# Patient Record
Sex: Female | Born: 1955
Health system: Southern US, Community
[De-identification: ages and names within clinical notes are randomized; demographics above are authoritative.]

## PROBLEM LIST (undated history)

## (undated) DIAGNOSIS — F32A Depression, unspecified: Secondary | ICD-10-CM

## (undated) DIAGNOSIS — M069 Rheumatoid arthritis, unspecified: Secondary | ICD-10-CM

## (undated) DIAGNOSIS — J382 Nodules of vocal cords: Secondary | ICD-10-CM

## (undated) DIAGNOSIS — K219 Gastro-esophageal reflux disease without esophagitis: Secondary | ICD-10-CM

## (undated) DIAGNOSIS — M8580 Other specified disorders of bone density and structure, unspecified site: Secondary | ICD-10-CM

## (undated) DIAGNOSIS — E785 Hyperlipidemia, unspecified: Secondary | ICD-10-CM

## (undated) DIAGNOSIS — E039 Hypothyroidism, unspecified: Secondary | ICD-10-CM

## (undated) DIAGNOSIS — F329 Major depressive disorder, single episode, unspecified: Secondary | ICD-10-CM

## (undated) DIAGNOSIS — K589 Irritable bowel syndrome without diarrhea: Secondary | ICD-10-CM

## (undated) DIAGNOSIS — E079 Disorder of thyroid, unspecified: Secondary | ICD-10-CM

## (undated) HISTORY — DX: Hyperlipidemia, unspecified: E78.5

## (undated) HISTORY — DX: Depression, unspecified: F32.A

## (undated) HISTORY — DX: Disorder of thyroid, unspecified: E07.9

## (undated) HISTORY — DX: Other specified disorders of bone density and structure, unspecified site: M85.80

## (undated) HISTORY — DX: Major depressive disorder, single episode, unspecified: F32.9

## (undated) HISTORY — DX: Nodules of vocal cords: J38.2

## (undated) HISTORY — DX: Irritable bowel syndrome, unspecified: K58.9

## (undated) HISTORY — PX: EYE SURGERY: SHX253

## (undated) HISTORY — DX: Gastro-esophageal reflux disease without esophagitis: K21.9

## (undated) HISTORY — DX: Rheumatoid arthritis, unspecified: M06.9

## (undated) HISTORY — DX: Hypothyroidism, unspecified: E03.9

---

## 2002-08-29 ENCOUNTER — Ambulatory Visit (HOSPITAL_COMMUNITY): Admission: RE | Admit: 2002-08-29 | Discharge: 2002-08-29 | Payer: Self-pay | Admitting: Family Medicine

## 2002-08-29 ENCOUNTER — Encounter: Payer: Self-pay | Admitting: Family Medicine

## 2002-09-04 ENCOUNTER — Ambulatory Visit (HOSPITAL_COMMUNITY): Admission: RE | Admit: 2002-09-04 | Discharge: 2002-09-04 | Payer: Self-pay | Admitting: Family Medicine

## 2002-09-04 ENCOUNTER — Encounter: Payer: Self-pay | Admitting: Family Medicine

## 2002-10-22 ENCOUNTER — Emergency Department (HOSPITAL_COMMUNITY): Admission: EM | Admit: 2002-10-22 | Discharge: 2002-10-22 | Payer: Self-pay | Admitting: Emergency Medicine

## 2004-01-04 ENCOUNTER — Ambulatory Visit (HOSPITAL_COMMUNITY): Admission: RE | Admit: 2004-01-04 | Discharge: 2004-01-04 | Payer: Self-pay | Admitting: Family Medicine

## 2004-03-03 ENCOUNTER — Ambulatory Visit: Payer: Self-pay | Admitting: Urgent Care

## 2004-04-13 ENCOUNTER — Ambulatory Visit: Payer: Self-pay | Admitting: Internal Medicine

## 2006-02-16 ENCOUNTER — Emergency Department (HOSPITAL_COMMUNITY): Admission: EM | Admit: 2006-02-16 | Discharge: 2006-02-16 | Payer: Self-pay | Admitting: Emergency Medicine

## 2007-01-07 ENCOUNTER — Ambulatory Visit (HOSPITAL_COMMUNITY): Admission: RE | Admit: 2007-01-07 | Discharge: 2007-01-07 | Payer: Self-pay | Admitting: Family Medicine

## 2008-06-08 ENCOUNTER — Ambulatory Visit (HOSPITAL_COMMUNITY): Admission: RE | Admit: 2008-06-08 | Discharge: 2008-06-08 | Payer: Self-pay | Admitting: Family Medicine

## 2009-11-01 ENCOUNTER — Ambulatory Visit (HOSPITAL_COMMUNITY): Admission: RE | Admit: 2009-11-01 | Discharge: 2009-11-01 | Payer: Self-pay | Admitting: Family Medicine

## 2009-11-08 ENCOUNTER — Encounter: Admission: RE | Admit: 2009-11-08 | Discharge: 2009-11-08 | Payer: Self-pay | Admitting: Family Medicine

## 2010-05-15 ENCOUNTER — Encounter: Payer: Self-pay | Admitting: Family Medicine

## 2010-11-28 ENCOUNTER — Other Ambulatory Visit: Payer: Self-pay | Admitting: Family Medicine

## 2010-11-28 DIAGNOSIS — Z139 Encounter for screening, unspecified: Secondary | ICD-10-CM

## 2010-12-12 ENCOUNTER — Ambulatory Visit (HOSPITAL_COMMUNITY)
Admission: RE | Admit: 2010-12-12 | Discharge: 2010-12-12 | Disposition: A | Discharge status: Home / Self Care | Payer: 59 | Source: Ambulatory Visit | Attending: Family Medicine | Admitting: Family Medicine

## 2010-12-12 DIAGNOSIS — Z139 Encounter for screening, unspecified: Secondary | ICD-10-CM

## 2010-12-12 DIAGNOSIS — Z1231 Encounter for screening mammogram for malignant neoplasm of breast: Secondary | ICD-10-CM | POA: Insufficient documentation

## 2011-10-30 ENCOUNTER — Other Ambulatory Visit: Payer: Self-pay | Admitting: Family Medicine

## 2011-10-30 ENCOUNTER — Ambulatory Visit (HOSPITAL_COMMUNITY)
Admission: RE | Admit: 2011-10-30 | Discharge: 2011-10-30 | Disposition: A | Discharge status: Home / Self Care | Payer: 59 | Source: Ambulatory Visit | Attending: Family Medicine | Admitting: Family Medicine

## 2011-10-30 DIAGNOSIS — R609 Edema, unspecified: Secondary | ICD-10-CM

## 2011-10-30 DIAGNOSIS — M25439 Effusion, unspecified wrist: Secondary | ICD-10-CM | POA: Insufficient documentation

## 2011-10-30 DIAGNOSIS — M25539 Pain in unspecified wrist: Secondary | ICD-10-CM | POA: Insufficient documentation

## 2011-10-30 DIAGNOSIS — R52 Pain, unspecified: Secondary | ICD-10-CM

## 2012-02-02 ENCOUNTER — Other Ambulatory Visit: Payer: Self-pay | Admitting: Family Medicine

## 2012-02-02 DIAGNOSIS — Z139 Encounter for screening, unspecified: Secondary | ICD-10-CM

## 2012-02-08 ENCOUNTER — Ambulatory Visit (HOSPITAL_COMMUNITY)
Admission: RE | Admit: 2012-02-08 | Discharge: 2012-02-08 | Disposition: A | Discharge status: Home / Self Care | Payer: 59 | Source: Ambulatory Visit | Attending: Family Medicine | Admitting: Family Medicine

## 2012-02-08 DIAGNOSIS — Z139 Encounter for screening, unspecified: Secondary | ICD-10-CM

## 2012-02-08 DIAGNOSIS — Z1231 Encounter for screening mammogram for malignant neoplasm of breast: Secondary | ICD-10-CM | POA: Insufficient documentation

## 2013-03-02 ENCOUNTER — Other Ambulatory Visit: Payer: Self-pay | Admitting: Family Medicine

## 2013-04-21 ENCOUNTER — Telehealth: Payer: Self-pay | Admitting: Family Medicine

## 2013-04-21 NOTE — Telephone Encounter (Signed)
Last office visit approx 1 yr ago

## 2013-04-21 NOTE — Telephone Encounter (Signed)
Pt requesting refill on [facility], she would like a couple months worth to get her through so she don't have to come in here during cold/flu season, please call pt when done

## 2013-04-21 NOTE — Telephone Encounter (Signed)
Ok plus two mo ref 

## 2013-04-22 MED ORDER — LEVOTHYROXINE SODIUM 50 MCG PO TABS: 50.0000 ug | ORAL_TABLET | Freq: Every day | ORAL | Status: DC

## 2013-04-22 NOTE — Telephone Encounter (Signed)
Med sent to pharm. Pt notified on voicemail.  

## 2013-06-30 ENCOUNTER — Telehealth: Payer: Self-pay | Admitting: Family Medicine

## 2013-06-30 NOTE — Telephone Encounter (Signed)
Made appt for physical and pap with Hoyle Sauer end of month.  Please fill out blood work papers and let her know when lab has them so she can get labs done before appt.

## 2013-07-02 ENCOUNTER — Other Ambulatory Visit: Payer: Self-pay | Admitting: Nurse Practitioner

## 2013-07-02 DIAGNOSIS — E039 Hypothyroidism, unspecified: Secondary | ICD-10-CM | POA: Insufficient documentation

## 2013-07-02 DIAGNOSIS — Z0189 Encounter for other specified special examinations: Secondary | ICD-10-CM

## 2013-07-02 DIAGNOSIS — E785 Hyperlipidemia, unspecified: Secondary | ICD-10-CM | POA: Insufficient documentation

## 2013-07-02 NOTE — Telephone Encounter (Signed)
Need paper chart. Thanks.

## 2013-07-14 ENCOUNTER — Other Ambulatory Visit: Payer: Self-pay | Admitting: Family Medicine

## 2013-07-14 DIAGNOSIS — Z1231 Encounter for screening mammogram for malignant neoplasm of breast: Secondary | ICD-10-CM

## 2013-07-16 ENCOUNTER — Encounter: Payer: Self-pay | Admitting: Nurse Practitioner

## 2013-07-17 ENCOUNTER — Ambulatory Visit (INDEPENDENT_AMBULATORY_CARE_PROVIDER_SITE_OTHER): Payer: 59 | Admitting: Nurse Practitioner

## 2013-07-17 ENCOUNTER — Encounter: Payer: Self-pay | Admitting: Nurse Practitioner

## 2013-07-17 ENCOUNTER — Ambulatory Visit (HOSPITAL_COMMUNITY)
Admission: RE | Admit: 2013-07-17 | Discharge: 2013-07-17 | Disposition: A | Discharge status: Home / Self Care | Payer: 59 | Source: Ambulatory Visit | Attending: Family Medicine | Admitting: Family Medicine

## 2013-07-17 VITALS — BP 124/70 | Ht 67.0 in | Wt 162.0 lb

## 2013-07-17 DIAGNOSIS — E785 Hyperlipidemia, unspecified: Secondary | ICD-10-CM

## 2013-07-17 DIAGNOSIS — Z1231 Encounter for screening mammogram for malignant neoplasm of breast: Secondary | ICD-10-CM

## 2013-07-17 DIAGNOSIS — Z0000 Encounter for general adult medical examination without abnormal findings: Secondary | ICD-10-CM

## 2013-07-17 DIAGNOSIS — Z124 Encounter for screening for malignant neoplasm of cervix: Secondary | ICD-10-CM

## 2013-07-17 DIAGNOSIS — Z01419 Encounter for gynecological examination (general) (routine) without abnormal findings: Secondary | ICD-10-CM

## 2013-07-17 DIAGNOSIS — Z23 Encounter for immunization: Secondary | ICD-10-CM

## 2013-07-18 LAB — HEPATIC FUNCTION PANEL
ALT: 22 U/L (ref 0–35)
AST: 23 U/L (ref 0–37)
Albumin: 4.4 g/dL (ref 3.5–5.2)
Alkaline Phosphatase: 58 U/L (ref 39–117)
BILIRUBIN DIRECT: 0.1 mg/dL (ref 0.0–0.3)
Indirect Bilirubin: 0.4 mg/dL (ref 0.2–1.2)
TOTAL PROTEIN: 6.9 g/dL (ref 6.0–8.3)
Total Bilirubin: 0.5 mg/dL (ref 0.2–1.2)

## 2013-07-18 LAB — BASIC METABOLIC PANEL
BUN: 11 mg/dL (ref 6–23)
CHLORIDE: 102 meq/L (ref 96–112)
CO2: 28 meq/L (ref 19–32)
CREATININE: 0.8 mg/dL (ref 0.50–1.10)
Calcium: 9.5 mg/dL (ref 8.4–10.5)
GLUCOSE: 89 mg/dL (ref 70–99)
POTASSIUM: 4.6 meq/L (ref 3.5–5.3)
Sodium: 138 mEq/L (ref 135–145)

## 2013-07-18 LAB — TSH: TSH: 2.729 u[IU]/mL (ref 0.350–4.500)

## 2013-07-18 LAB — LIPID PANEL
CHOL/HDL RATIO: 4.6 ratio
CHOLESTEROL: 246 mg/dL — AB (ref 0–200)
HDL: 53 mg/dL (ref >39)
LDL Cholesterol: 173 mg/dL — ABNORMAL HIGH (ref 0–99)
Triglycerides: 100 mg/dL (ref <150)
VLDL: 20 mg/dL (ref 0–40)

## 2013-07-18 LAB — PAP IG W/ RFLX HPV ASCU

## 2013-07-19 LAB — VITAMIN D 25 HYDROXY (VIT D DEFICIENCY, FRACTURES): VIT D 25 HYDROXY: 72 ng/mL (ref 30–89)

## 2013-07-21 NOTE — Progress Notes (Signed)
Sent over lab work results to [facility]

## 2013-07-22 ENCOUNTER — Other Ambulatory Visit: Payer: Self-pay | Admitting: *Deleted

## 2013-07-22 ENCOUNTER — Encounter: Payer: Self-pay | Admitting: Nurse Practitioner

## 2013-07-22 MED ORDER — LEVOTHYROXINE SODIUM 50 MCG PO TABS: 50.0000 ug | ORAL_TABLET | Freq: Every day | ORAL | Status: DC

## 2013-07-22 NOTE — Progress Notes (Signed)
   Subjective:    Patient ID: Henrene Hawking, female    DOB: 1956-01-29, 58 y.o.   MRN: [id number]  HPI  presents for her wellness checkup. Has not had any menstrual bleeding since she was 58 years old. No pelvic pain. No discharge. Married, same sexual partner. Regular vision and dental care. Overall healthy diet. Gets regular exercise.    Review of Systems  Constitutional: Negative for activity change, appetite change, fatigue and unexpected weight change.  HENT: Negative for dental problem, ear pain, sinus pressure and sore throat.   Respiratory: Negative for cough, chest tightness, shortness of breath and wheezing.   Cardiovascular: Negative for chest pain and leg swelling.  Gastrointestinal: Negative for nausea, vomiting, abdominal pain, diarrhea, constipation and blood in stool.  Genitourinary: Negative for dysuria, urgency, frequency, vaginal bleeding, vaginal discharge, enuresis, difficulty urinating, genital sores and pelvic pain.       Objective:   Physical Exam  Vitals reviewed. Constitutional: She is oriented to person, place, and time. She appears well-developed. No distress.  HENT:  Right Ear: External ear normal.  Left Ear: External ear normal.  Mouth/Throat: Oropharynx is clear and moist.  Neck: Normal range of motion. Neck supple. No tracheal deviation present. No thyromegaly present.  Cardiovascular: Normal rate, regular rhythm and normal heart sounds.  Exam reveals no gallop.   No murmur heard. Pulmonary/Chest: Effort normal and breath sounds normal.  Abdominal: Soft. She exhibits no distension. There is no tenderness.  Genitourinary: Vagina normal and uterus normal. No vaginal discharge found.  External GU: No lesions or rash is noted. Vagina no discharge. Cervix normal limit in appearance. Bimanual exam no obvious masses, nontender. Ovaries nonpalpable.  Musculoskeletal: She exhibits no edema.  Lymphadenopathy:    She has no cervical adenopathy.    Neurological: She is alert and oriented to person, place, and time.  Skin: Skin is warm and dry. No rash noted.  Psychiatric: She has a normal mood and affect. Her behavior is normal.   Breast exam: No masses noted; axilla no adenopathy. Rectal exam normal, no stool for Hemoccult.        Assessment & Plan:  Well woman exam - Plan: POC Hemoccult Bld/Stl (3-Cd Home Screen)  Screening for cervical cancer - Plan: Pap IG w/ reflex to HPV when ASC-U  Need for prophylactic vaccination with combined diphtheria-tetanus-pertussis (DTP) vaccine - Plan: Tdap vaccine greater than or equal to 7yo IM  Continues healthy diet and regular exercise. Recommend daily vitamin D and calcium supplementation. Next physical in one year.

## 2013-07-24 ENCOUNTER — Encounter: Payer: Self-pay | Admitting: *Deleted

## 2013-07-25 ENCOUNTER — Other Ambulatory Visit: Payer: Self-pay | Admitting: *Deleted

## 2013-07-25 DIAGNOSIS — Z01419 Encounter for gynecological examination (general) (routine) without abnormal findings: Secondary | ICD-10-CM

## 2013-07-25 LAB — POC HEMOCCULT BLD/STL (HOME/3-CARD/SCREEN)
Card #2 Fecal Occult Blod, POC: NEGATIVE
FECAL OCCULT BLD: NEGATIVE
FECAL OCCULT BLD: NEGATIVE

## 2013-09-01 ENCOUNTER — Telehealth: Payer: Self-pay | Admitting: Family Medicine

## 2013-09-01 ENCOUNTER — Other Ambulatory Visit: Payer: Self-pay | Admitting: Nurse Practitioner

## 2013-09-01 MED ORDER — LEVOTHYROXINE SODIUM 50 MCG PO TABS: 50.0000 ug | ORAL_TABLET | Freq: Every day | ORAL | Status: DC

## 2013-09-01 NOTE — Telephone Encounter (Signed)
Patient needs Rx written for levothyroxine 50 mcg to pick up for mail order. She would like this ASAP.

## 2013-09-01 NOTE — Telephone Encounter (Signed)
Printed and signed as requested.

## 2013-09-01 NOTE — Telephone Encounter (Signed)
Pt notified rx ready for pick up.

## 2014-04-06 ENCOUNTER — Ambulatory Visit (INDEPENDENT_AMBULATORY_CARE_PROVIDER_SITE_OTHER): Payer: 59 | Admitting: Family Medicine

## 2014-04-06 ENCOUNTER — Encounter: Payer: Self-pay | Admitting: Family Medicine

## 2014-04-06 VITALS — BP 110/72 | Temp 98.2°F | Ht 67.0 in | Wt 171.0 lb

## 2014-04-06 DIAGNOSIS — N950 Postmenopausal bleeding: Secondary | ICD-10-CM

## 2014-04-06 DIAGNOSIS — N939 Abnormal uterine and vaginal bleeding, unspecified: Secondary | ICD-10-CM

## 2014-04-06 DIAGNOSIS — R319 Hematuria, unspecified: Secondary | ICD-10-CM

## 2014-04-06 LAB — POCT URINALYSIS DIPSTICK
Blood, UA: 250
SPEC GRAV UA: 1.02
UROBILINOGEN UA: 2
pH, UA: 5

## 2014-04-06 MED ORDER — CEFPROZIL 500 MG PO TABS: 500.0000 mg | ORAL_TABLET | Freq: Two times a day (BID) | ORAL | Status: AC

## 2014-04-06 MED ORDER — PHENAZOPYRIDINE HCL 200 MG PO TABS: 200.0000 mg | ORAL_TABLET | Freq: Three times a day (TID) | ORAL | Status: DC | PRN

## 2014-04-06 NOTE — Patient Instructions (Signed)

## 2014-04-06 NOTE — Progress Notes (Signed)
   Subjective:    Patient ID: Kristy Moon, female    DOB: August 06, 1955, 58 y.o.   MRN: [id number]  Dysuria  This is a new problem. Episode onset: Friday. The problem occurs every urination. The problem has been gradually worsening. The quality of the pain is described as burning. The pain is mild. There has been no fever. Associated symptoms include frequency. Associated symptoms comments: Pelvic pain, vaginal bleeding (started today). She has tried NSAIDs for the symptoms. The treatment provided mild relief.    Patient states that she noticed blood when she wipes she thought she was also bleeding from the vagina she inserted her fingers into the vagina and noticed blood on him that alarmed her she has not had a cycle for several years had normal female exam earlier this year with nurse practitioner No family history of any type uterine cancer Review of Systems  Genitourinary: Positive for dysuria and frequency.   UA with multiple WBCs culture sent    Objective:   Physical Exam  Abdomen soft no guarding rebound or tenderness pelvic exam there is minimal amount of blood at the introitus there is none down near the cervix no enlargement of the uterus    Assessment & Plan:  Uterine ultrasound recommended to rule out the possibility of thickened endometrial stripe. In addition to this I told the patient at the uterine oxalic looks normal in that if there is no further episodes of bleeding that this is possibly blood from the UTI that got at the introitus of the vagina I told her if she has any ongoing spells of bleeding the next step would be to talk with [place] and we would refer her to gynecology for endometrial biopsy  Hematuria/UTI-definite infection under the microscope treat with antibiotics culture await the results

## 2014-04-08 LAB — URINE CULTURE
COLONY COUNT: NO GROWTH
ORGANISM ID, BACTERIA: NO GROWTH

## 2014-04-10 ENCOUNTER — Other Ambulatory Visit: Payer: Self-pay | Admitting: Family Medicine

## 2014-04-10 DIAGNOSIS — N939 Abnormal uterine and vaginal bleeding, unspecified: Secondary | ICD-10-CM

## 2014-04-13 ENCOUNTER — Ambulatory Visit (HOSPITAL_COMMUNITY): Payer: 59

## 2014-04-13 ENCOUNTER — Ambulatory Visit (HOSPITAL_COMMUNITY)
Admission: RE | Admit: 2014-04-13 | Discharge: 2014-04-13 | Disposition: A | Discharge status: Home / Self Care | Payer: 59 | Source: Ambulatory Visit | Attending: Family Medicine | Admitting: Family Medicine

## 2014-04-13 DIAGNOSIS — N950 Postmenopausal bleeding: Secondary | ICD-10-CM | POA: Insufficient documentation

## 2014-04-13 DIAGNOSIS — N939 Abnormal uterine and vaginal bleeding, unspecified: Secondary | ICD-10-CM

## 2014-07-23 ENCOUNTER — Encounter: Payer: Self-pay | Admitting: Nurse Practitioner

## 2014-07-23 ENCOUNTER — Ambulatory Visit (INDEPENDENT_AMBULATORY_CARE_PROVIDER_SITE_OTHER): Payer: 59 | Admitting: Nurse Practitioner

## 2014-07-23 ENCOUNTER — Ambulatory Visit: Payer: Self-pay | Admitting: Family Medicine

## 2014-07-23 VITALS — BP 108/62 | Temp 98.6°F | Ht 67.0 in | Wt 165.0 lb

## 2014-07-23 DIAGNOSIS — K529 Noninfective gastroenteritis and colitis, unspecified: Secondary | ICD-10-CM

## 2014-07-23 DIAGNOSIS — T3695XA Adverse effect of unspecified systemic antibiotic, initial encounter: Principal | ICD-10-CM

## 2014-07-23 DIAGNOSIS — Z79899 Other long term (current) drug therapy: Secondary | ICD-10-CM | POA: Diagnosis not present

## 2014-07-23 DIAGNOSIS — E785 Hyperlipidemia, unspecified: Secondary | ICD-10-CM

## 2014-07-23 DIAGNOSIS — K521 Toxic gastroenteritis and colitis: Secondary | ICD-10-CM

## 2014-07-23 MED ORDER — PRAVASTATIN SODIUM 10 MG PO TABS: 10.0000 mg | ORAL_TABLET | Freq: Every day | ORAL | Status: DC

## 2014-07-23 MED ORDER — METRONIDAZOLE 500 MG PO TABS: 500.0000 mg | ORAL_TABLET | Freq: Three times a day (TID) | ORAL | Status: DC

## 2014-07-23 NOTE — Patient Instructions (Signed)
Align as directed (2 cups of activia yogurt)

## 2014-07-24 ENCOUNTER — Encounter: Payer: Self-pay | Admitting: Nurse Practitioner

## 2014-07-24 NOTE — Progress Notes (Signed)
Subjective:  Presents for c/o watery diarrhea x 1 month. Began after taking Augmentin. Occurs a few times until supper; worse after this occuring 2-3 x per night. Unassociated with particular foods. Abdominal pain only with BMs. No blood in stools. No known contacts. No fever. No relief with Immodium. Nausea no vomiting. She did not respond to information from last lab work done March 2015. This was reviewed during office visit.  Objective:   BP 108/62 mmHg  Temp(Src) 98.6 F (37 C) (Oral)  Ht 5\' 7"  (1.702 m)  Wt 165 lb (74.844 kg)  BMI 25.84 kg/m2 NAD. Alert, oriented. Lungs clear. Heart regular rhythm. Abdomen soft nondistended with hyperactive bowel sounds 4, nontender. No obvious masses. Lab work dated 07/18/2013 shows an LDL of 173 with minimal improvement from previous labs.  Assessment: Antibiotic-associated diarrhea  Hyperlipemia - Plan: Lipid panel  High risk medication use - Plan: Hepatic function panel  Plan:  Meds ordered this encounter  Medications  . metroNIDAZOLE (FLAGYL) 500 MG tablet    Sig: Take 1 tablet (500 mg total) by mouth 3 (three) times daily.    Dispense:  30 tablet    Refill:  0    Order Specific Question:  Supervising Provider    Answer:  Mikey Kirschner [[id number]]  . pravastatin (PRAVACHOL) 10 MG tablet    Sig: Take 1 tablet (10 mg total) by mouth daily. For cholesterol    Dispense:  30 tablet    Refill:  2    Order Specific Question:  Supervising Provider    Answer:  Mikey Kirschner [[id number]]   Start pravastatin as directed. Repeat lipid and liver profiles in 8-10 weeks. Start metronidazole. Bowel probiotics. Call back in 10-14 days if symptoms persist, sooner if worse. Warning signs reviewed.

## 2014-07-30 ENCOUNTER — Other Ambulatory Visit: Payer: Self-pay

## 2014-07-30 ENCOUNTER — Other Ambulatory Visit: Payer: Self-pay | Admitting: *Deleted

## 2014-07-30 MED ORDER — LEVOTHYROXINE SODIUM 50 MCG PO TABS: 50.0000 ug | ORAL_TABLET | Freq: Every day | ORAL | Status: DC

## 2014-09-15 ENCOUNTER — Telehealth: Payer: Self-pay | Admitting: Family Medicine

## 2014-09-15 DIAGNOSIS — E039 Hypothyroidism, unspecified: Secondary | ICD-10-CM

## 2014-09-15 DIAGNOSIS — E785 Hyperlipidemia, unspecified: Secondary | ICD-10-CM

## 2014-09-15 MED ORDER — PRAVASTATIN SODIUM 10 MG PO TABS: 10.0000 mg | ORAL_TABLET | Freq: Every day | ORAL | Status: DC

## 2014-09-15 NOTE — Telephone Encounter (Signed)
Since carolyn actually disc this in her march note, can give 90 d, but pt needs to get bw as rec also add tsh

## 2014-09-15 NOTE — Telephone Encounter (Signed)
Pt has been in for sick visits. Last check up over 1 year march 2015. Lipid ordered on 07/23/14 but no results. Pt requesting 90 day supply of cholesterol med.

## 2014-09-15 NOTE — Telephone Encounter (Signed)
Pt is requesting her pravastatin be faxed over [facility] at [phone]

## 2014-09-15 NOTE — Telephone Encounter (Signed)
Left message on voicemail notifying patient that med was sent in and blood work has been ordered for her to complete.

## 2014-11-13 ENCOUNTER — Other Ambulatory Visit: Payer: Self-pay | Admitting: Nurse Practitioner

## 2014-11-13 LAB — HEPATIC FUNCTION PANEL
ALT: 26 U/L (ref 0–35)
AST: 25 U/L (ref 0–37)
Albumin: 4 g/dL (ref 3.5–5.2)
Alkaline Phosphatase: 56 U/L (ref 39–117)
BILIRUBIN INDIRECT: 0.5 mg/dL (ref 0.2–1.2)
Bilirubin, Direct: 0.1 mg/dL (ref 0.0–0.3)
TOTAL PROTEIN: 6.9 g/dL (ref 6.0–8.3)
Total Bilirubin: 0.6 mg/dL (ref 0.2–1.2)

## 2014-11-13 LAB — LIPID PANEL
CHOL/HDL RATIO: 3.7 ratio
CHOLESTEROL: 181 mg/dL (ref 0–200)
HDL: 49 mg/dL (ref >=46)
LDL Cholesterol: 112 mg/dL — ABNORMAL HIGH (ref 0–99)
TRIGLYCERIDES: 100 mg/dL (ref <150)
VLDL: 20 mg/dL (ref 0–40)

## 2014-11-17 LAB — HEPATIC FUNCTION PANEL

## 2014-11-17 LAB — LIPID PANEL

## 2014-11-17 LAB — PLEASE NOTE

## 2015-02-15 ENCOUNTER — Telehealth: Payer: Self-pay | Admitting: Family Medicine

## 2015-02-15 NOTE — Telephone Encounter (Signed)
Patient needs Rx for pravastatin (PRAVACHOL) 10 MG tablet.  She is requesting just one refill.  She is currently in [place] with her new grandbaby.  [place] [phone]

## 2015-02-16 ENCOUNTER — Other Ambulatory Visit: Payer: Self-pay | Admitting: Nurse Practitioner

## 2015-02-16 MED ORDER — PRAVASTATIN SODIUM 10 MG PO TABS: 10.0000 mg | ORAL_TABLET | Freq: Every day | ORAL | Status: DC

## 2015-02-16 NOTE — Telephone Encounter (Signed)
I need to know street in [place] so I can sent electronically; this number does not match a [facility] in our system

## 2015-02-16 NOTE — Telephone Encounter (Signed)
[address] , [place]

## 2015-03-05 ENCOUNTER — Other Ambulatory Visit: Payer: Self-pay | Admitting: *Deleted

## 2015-03-05 MED ORDER — PRAVASTATIN SODIUM 10 MG PO TABS: 10.0000 mg | ORAL_TABLET | Freq: Every day | ORAL | Status: DC

## 2015-03-05 MED ORDER — LEVOTHYROXINE SODIUM 50 MCG PO TABS: 50.0000 ug | ORAL_TABLET | Freq: Every day | ORAL | Status: DC

## 2015-03-14 ENCOUNTER — Other Ambulatory Visit: Payer: Self-pay | Admitting: Nurse Practitioner

## 2015-03-15 NOTE — Telephone Encounter (Signed)
Ok plus one ref chronic prob o v with me or carolyn in early jan

## 2015-03-25 ENCOUNTER — Other Ambulatory Visit: Payer: Self-pay | Admitting: Family Medicine

## 2015-03-25 NOTE — Telephone Encounter (Signed)
May we refill? 

## 2015-03-25 NOTE — Telephone Encounter (Signed)
This pt needs to do TSH AND OV, may have 30 days

## 2015-03-30 ENCOUNTER — Other Ambulatory Visit: Payer: Self-pay | Admitting: Family Medicine

## 2015-03-30 NOTE — Telephone Encounter (Signed)
Last office visit March 2015. May we refill?

## 2015-03-30 NOTE — Telephone Encounter (Signed)
15 days only and sched visit with carolyn or myself within that time pt's choice

## 2015-04-01 ENCOUNTER — Ambulatory Visit (INDEPENDENT_AMBULATORY_CARE_PROVIDER_SITE_OTHER): Payer: Commercial Managed Care - HMO | Admitting: Nurse Practitioner

## 2015-04-01 ENCOUNTER — Encounter: Payer: Self-pay | Admitting: Nurse Practitioner

## 2015-04-01 VITALS — BP 108/64 | Ht 67.0 in | Wt 162.0 lb

## 2015-04-01 DIAGNOSIS — E785 Hyperlipidemia, unspecified: Secondary | ICD-10-CM

## 2015-04-01 DIAGNOSIS — E039 Hypothyroidism, unspecified: Secondary | ICD-10-CM | POA: Diagnosis not present

## 2015-04-01 DIAGNOSIS — F329 Major depressive disorder, single episode, unspecified: Secondary | ICD-10-CM | POA: Diagnosis not present

## 2015-04-01 DIAGNOSIS — Z23 Encounter for immunization: Secondary | ICD-10-CM

## 2015-04-01 DIAGNOSIS — F32A Depression, unspecified: Secondary | ICD-10-CM | POA: Insufficient documentation

## 2015-04-01 MED ORDER — PRAVASTATIN SODIUM 10 MG PO TABS: ORAL_TABLET | ORAL | Status: DC

## 2015-04-01 MED ORDER — LEVOTHYROXINE SODIUM 50 MCG PO TABS: ORAL_TABLET | ORAL | Status: DC

## 2015-04-01 NOTE — Patient Instructions (Signed)
Soy Flaxseed Black cohosh 

## 2015-04-05 ENCOUNTER — Encounter: Payer: Self-pay | Admitting: Nurse Practitioner

## 2015-04-05 NOTE — Progress Notes (Signed)
Subjective:  Presents for routine follow-up. Compliant with medications. No chest pain/ischemic type pain or shortness of breath. Doing fairly well with her diet. Active lifestyle. Has noticed an increase in her depression symptoms, unsure if it's related to the winter season. Has Lexapro 10 mg at home, has been cutting it in half for dose of 5 mg.  Objective:   BP 108/64 mmHg  Ht 5\' 7"  (1.702 m)  Wt 162 lb (73.483 kg)  BMI 25.37 kg/m2 NAD. Alert, oriented. Lungs clear. Heart regular rate rhythm. Cheerful affect. Thoughts logical coherent and relevant. Dressed appropriately. Thyroid nontender to Palpation, no obvious masses.  Assessment:  Problem List Items Addressed This Visit      Endocrine   Hypothyroidism - Primary   Relevant Medications   levothyroxine (SYNTHROID) 50 MCG tablet     Other   Depression   Hyperlipemia   Relevant Medications   pravastatin (PRAVACHOL) 10 MG tablet    Other Visit Diagnoses    Encounter for immunization          Continue current medications as directed. Encouraged healthy diet and regular activity. Increase Lexapro dose to 10 mg daily. Call back if further problems. Return in about 6 months (around 09/30/2015) for physical.

## 2015-04-23 ENCOUNTER — Other Ambulatory Visit: Payer: Self-pay | Admitting: Family Medicine

## 2015-06-07 ENCOUNTER — Ambulatory Visit (INDEPENDENT_AMBULATORY_CARE_PROVIDER_SITE_OTHER): Payer: Commercial Managed Care - HMO | Admitting: Family Medicine

## 2015-06-07 ENCOUNTER — Encounter: Payer: Self-pay | Admitting: Family Medicine

## 2015-06-07 VITALS — BP 110/72 | Temp 98.5°F | Ht 67.0 in | Wt 163.0 lb

## 2015-06-07 DIAGNOSIS — N390 Urinary tract infection, site not specified: Secondary | ICD-10-CM | POA: Diagnosis not present

## 2015-06-07 DIAGNOSIS — R300 Dysuria: Secondary | ICD-10-CM

## 2015-06-07 LAB — POCT URINALYSIS DIPSTICK
PH UA: 5
Spec Grav, UA: 1.02

## 2015-06-07 MED ORDER — CIPROFLOXACIN HCL 250 MG PO TABS: 250.0000 mg | ORAL_TABLET | Freq: Two times a day (BID) | ORAL | Status: DC

## 2015-06-07 NOTE — Progress Notes (Signed)
   Subjective:    Patient ID: Kristy Moon, female    DOB: 11/24/1955, 60 y.o.   MRN: [id number]  Urinary Tract Infection  This is a new problem. Episode onset: 4 days ago. Associated symptoms include hematuria and urgency. Treatments tried: ibuprofen.   Results for orders placed or performed in visit on 06/07/15  POCT urinalysis dipstick  Result Value Ref Range   Color, UA     Clarity, UA     Glucose, UA     Bilirubin, UA +    Ketones, UA     Spec Grav, UA 1.020    Blood, UA     pH, UA 5.0    Protein, UA     Urobilinogen, UA     Nitrite, UA     Leukocytes, UA  Negative   burnong and freq  Took ibu prn  pps blood in urine  noeeds urge to go   No fever or chills, slight sweat after urnie   patient has increased urine output with increased by mouth intake  Review of Systems  Genitourinary: Positive for urgency and hematuria.   no major abdominal pain no fever no chills     Objective:   Physical Exam  alert final stable no CVA tenderness lungs clear heart rare rhythm abdomen benign next  Urinalysis 1-2 white blood cells per high-power field no hematuria at this time       Assessment & Plan:   impression mild UTI with transient hematuria plan Cipro twice a day 5 days. If hematuria persists or recurs return. Had a summer episode over a year ago but with 0 urinary symptoms in the interim WSL

## 2015-09-12 ENCOUNTER — Other Ambulatory Visit: Payer: Self-pay | Admitting: Nurse Practitioner

## 2015-09-29 ENCOUNTER — Ambulatory Visit (INDEPENDENT_AMBULATORY_CARE_PROVIDER_SITE_OTHER): Payer: Commercial Managed Care - HMO | Admitting: Nurse Practitioner

## 2015-09-29 ENCOUNTER — Encounter: Payer: Self-pay | Admitting: Nurse Practitioner

## 2015-09-29 VITALS — BP 102/66 | Ht 67.0 in | Wt 159.1 lb

## 2015-09-29 DIAGNOSIS — F329 Major depressive disorder, single episode, unspecified: Secondary | ICD-10-CM | POA: Diagnosis not present

## 2015-09-29 DIAGNOSIS — Z1321 Encounter for screening for nutritional disorder: Secondary | ICD-10-CM

## 2015-09-29 DIAGNOSIS — M25511 Pain in right shoulder: Secondary | ICD-10-CM | POA: Diagnosis not present

## 2015-09-29 DIAGNOSIS — Z1231 Encounter for screening mammogram for malignant neoplasm of breast: Secondary | ICD-10-CM

## 2015-09-29 DIAGNOSIS — R5383 Other fatigue: Secondary | ICD-10-CM

## 2015-09-29 DIAGNOSIS — F32A Depression, unspecified: Secondary | ICD-10-CM

## 2015-09-29 DIAGNOSIS — E039 Hypothyroidism, unspecified: Secondary | ICD-10-CM

## 2015-09-29 DIAGNOSIS — E785 Hyperlipidemia, unspecified: Secondary | ICD-10-CM

## 2015-09-29 DIAGNOSIS — Z0000 Encounter for general adult medical examination without abnormal findings: Secondary | ICD-10-CM

## 2015-09-29 DIAGNOSIS — Z01419 Encounter for gynecological examination (general) (routine) without abnormal findings: Secondary | ICD-10-CM

## 2015-09-30 ENCOUNTER — Encounter: Payer: Self-pay | Admitting: Nurse Practitioner

## 2015-09-30 LAB — BASIC METABOLIC PANEL
BUN / CREAT RATIO: 14 (ref 9–23)
BUN: 10 mg/dL (ref 6–24)
CO2: 25 mmol/L (ref 18–29)
CREATININE: 0.73 mg/dL (ref 0.57–1.00)
Calcium: 9.8 mg/dL (ref 8.7–10.2)
Chloride: 102 mmol/L (ref 96–106)
GFR, EST AFRICAN AMERICAN: 104 mL/min/{1.73_m2} (ref >59)
GFR, EST NON AFRICAN AMERICAN: 90 mL/min/{1.73_m2} (ref >59)
GLUCOSE: 87 mg/dL (ref 65–99)
Potassium: 4.8 mmol/L (ref 3.5–5.2)
SODIUM: 143 mmol/L (ref 134–144)

## 2015-09-30 LAB — HEPATIC FUNCTION PANEL
ALK PHOS: 62 IU/L (ref 39–117)
ALT: 23 IU/L (ref 0–32)
AST: 23 IU/L (ref 0–40)
Albumin: 4.4 g/dL (ref 3.5–5.5)
Bilirubin Total: 0.4 mg/dL (ref 0.0–1.2)
Bilirubin, Direct: 0.12 mg/dL (ref 0.00–0.40)
Total Protein: 7.4 g/dL (ref 6.0–8.5)

## 2015-09-30 LAB — LIPID PANEL
CHOLESTEROL TOTAL: 191 mg/dL (ref 100–199)
Chol/HDL Ratio: 3.2 ratio units (ref 0.0–4.4)
HDL: 60 mg/dL (ref >39)
LDL Calculated: 115 mg/dL — ABNORMAL HIGH (ref 0–99)
TRIGLYCERIDES: 81 mg/dL (ref 0–149)
VLDL Cholesterol Cal: 16 mg/dL (ref 5–40)

## 2015-09-30 LAB — VITAMIN D 25 HYDROXY (VIT D DEFICIENCY, FRACTURES): VIT D 25 HYDROXY: 41.3 ng/mL (ref 30.0–100.0)

## 2015-09-30 LAB — TSH: TSH: 1.79 u[IU]/mL (ref 0.450–4.500)

## 2015-09-30 NOTE — Progress Notes (Signed)
Subjective:    Patient ID: Kristy Moon, female    DOB: 01/08/1956, 60 y.o.   MRN: [id number]  HPI Presents for her wellness physical. Married, same sexual partner. No vaginal bleeding or pelvic pain. Regular vision and dental exams. Regular exercise. Is due for her colonoscopy. Has had right shoulder pain x 2 months. Worse with certain movements. No specific history of injury.     Review of Systems  Constitutional: Negative for activity change, appetite change and fatigue.  HENT: Negative for dental problem, ear pain, sinus pressure and sore throat.   Respiratory: Negative for cough, chest tightness, shortness of breath and wheezing.   Cardiovascular: Negative for chest pain.  Gastrointestinal: Negative for nausea, vomiting, abdominal pain, diarrhea, constipation, blood in stool and abdominal distention.  Genitourinary: Negative for dysuria, urgency, frequency, vaginal bleeding, vaginal discharge, enuresis, difficulty urinating, genital sores and pelvic pain.       Objective:   Physical Exam  Constitutional: She is oriented to person, place, and time. She appears well-developed. No distress.  HENT:  Right Ear: External ear normal.  Left Ear: External ear normal.  Mouth/Throat: Oropharynx is clear and moist.  Neck: Normal range of motion. Neck supple. No tracheal deviation present. No thyromegaly present.  Cardiovascular: Normal rate, regular rhythm and normal heart sounds.  Exam reveals no gallop.   No murmur heard. Pulmonary/Chest: Effort normal and breath sounds normal.  Abdominal: Soft. She exhibits no distension. There is no tenderness.  Genitourinary: Vagina normal and uterus normal. No vaginal discharge found.  External GU: no rashes or lesions. Vagina: no discharge, slightly pale and dry. Cervix normal in appearance. No CMT. Bimanual exam: no tenderness or obvious masses. Rectal exam not done due to need for colonoscopy.   Musculoskeletal:  Tenderness in the mid  shoulder joint line. Can perform range of motion with minimal difficulty, mostly with posterior internal rotation. Signs of impingement noted with full rotation of the shoulder above shoulder height.   Lymphadenopathy:    She has no cervical adenopathy.  Neurological: She is alert and oriented to person, place, and time.  Skin: Skin is warm and dry. No rash noted.  Psychiatric: She has a normal mood and affect. Her behavior is normal.  Vitals reviewed. [place] exam: no masses; axillae no adenopathy.         Assessment & Plan:   Problem List Items Addressed This Visit      Endocrine   Hypothyroidism   Relevant Orders   TSH (Completed)     Other   Depression   Hyperlipemia   Relevant Orders   Basic metabolic panel (Completed)   Hepatic function panel (Completed)   Lipid panel (Completed)    Other Visit Diagnoses    Well woman exam    -  Primary    Relevant Orders    Basic metabolic panel (Completed)    Hepatic function panel (Completed)    Right shoulder pain        Relevant Orders    DG Shoulder Right    Encounter for vitamin deficiency screening        Relevant Orders    VITAMIN D 25 Hydroxy (Vit-D Deficiency, Fractures) (Completed)    Other fatigue        Relevant Orders    VITAMIN D 25 Hydroxy (Vit-D Deficiency, Fractures) (Completed)    Visit for screening mammogram        Relevant Orders    MM DIGITAL SCREENING BILATERAL  Recommend daily vitamin D and calcium.  Shoulder exercises given; NSAIDs as directed; ice/heat applications. Xray pending. Call back in 2 weeks if persists, sooner if worse.

## 2015-10-04 ENCOUNTER — Encounter: Payer: Self-pay | Admitting: Nurse Practitioner

## 2015-10-07 ENCOUNTER — Ambulatory Visit (HOSPITAL_COMMUNITY): Payer: Self-pay

## 2015-10-08 ENCOUNTER — Ambulatory Visit (HOSPITAL_COMMUNITY)
Admission: RE | Admit: 2015-10-08 | Discharge: 2015-10-08 | Disposition: A | Discharge status: Home / Self Care | Payer: Commercial Managed Care - HMO | Source: Ambulatory Visit | Attending: Nurse Practitioner | Admitting: Nurse Practitioner

## 2015-10-08 ENCOUNTER — Ambulatory Visit (HOSPITAL_COMMUNITY): Admission: RE | Admit: 2015-10-08 | Payer: Commercial Managed Care - HMO | Source: Ambulatory Visit

## 2015-10-08 ENCOUNTER — Other Ambulatory Visit: Payer: Self-pay | Admitting: Nurse Practitioner

## 2015-10-08 DIAGNOSIS — M25511 Pain in right shoulder: Secondary | ICD-10-CM | POA: Insufficient documentation

## 2015-10-08 DIAGNOSIS — Z1231 Encounter for screening mammogram for malignant neoplasm of breast: Secondary | ICD-10-CM | POA: Diagnosis present

## 2015-10-14 ENCOUNTER — Ambulatory Visit (INDEPENDENT_AMBULATORY_CARE_PROVIDER_SITE_OTHER): Payer: Commercial Managed Care - HMO | Admitting: Family Medicine

## 2015-10-14 ENCOUNTER — Encounter: Payer: Self-pay | Admitting: Family Medicine

## 2015-10-14 VITALS — BP 106/58 | Temp 98.2°F | Ht 67.0 in | Wt 160.0 lb

## 2015-10-14 DIAGNOSIS — N3000 Acute cystitis without hematuria: Secondary | ICD-10-CM

## 2015-10-14 DIAGNOSIS — R300 Dysuria: Secondary | ICD-10-CM | POA: Diagnosis not present

## 2015-10-14 MED ORDER — PHENAZOPYRIDINE HCL 200 MG PO TABS: 200.0000 mg | ORAL_TABLET | Freq: Three times a day (TID) | ORAL | Status: DC | PRN

## 2015-10-14 MED ORDER — CIPROFLOXACIN HCL 500 MG PO TABS: 500.0000 mg | ORAL_TABLET | Freq: Two times a day (BID) | ORAL | Status: DC

## 2015-10-14 NOTE — Progress Notes (Signed)
   Subjective:    Patient ID: Kristy Moon, female    DOB: [birth year], 60 y.o.   MRN: [id number]  Urinary Tract Infection  This is a new problem. Episode onset: 3 days. Associated symptoms comments: Dysuria . Treatments tried: motrin, pyridium.  Patient gets these approximately 2 or 3 times per year denies high fever chills denies abdominal pain denies flank pain no nausea vomiting diarrhea. Patient has not had a hysterectomy    Review of Systems    see above Objective:   Physical Exam  Lungs clear heart regular flank nontender      Assessment & Plan:  UTI-antibiotics sent culture ordered await the results if frequent UTIs may need referral to urology for further evaluation

## 2015-10-16 LAB — URINE CULTURE

## 2015-10-16 LAB — PLEASE NOTE

## 2015-10-17 ENCOUNTER — Encounter: Payer: Self-pay | Admitting: Family Medicine

## 2015-11-25 ENCOUNTER — Other Ambulatory Visit: Payer: Self-pay | Admitting: Nurse Practitioner

## 2016-01-09 ENCOUNTER — Other Ambulatory Visit: Payer: Self-pay | Admitting: Nurse Practitioner

## 2016-03-22 ENCOUNTER — Telehealth: Payer: Self-pay

## 2016-03-22 NOTE — Telephone Encounter (Signed)
[phone], PLEASE CALL PATIENT ABOUT SCHEDULING A TCS.  PCP TOLD HER TO HAVE ONE

## 2016-03-23 ENCOUNTER — Telehealth: Payer: Self-pay

## 2016-03-23 NOTE — Telephone Encounter (Signed)
Triaged today.  

## 2016-03-29 NOTE — Telephone Encounter (Signed)
Gastroenterology Pre-Procedure Review  Request Date: 03/23/2016 Requesting Physician: Dr. Mickie Hillier  PATIENT REVIEW QUESTIONS: The patient responded to the following health history questions as indicated:    Pt said said she has constipation sometimes  1. Diabetes Melitis: no 2. Joint replacements in the past 12 months: no 3. Major health problems in the past 3 months: no 4. Has an artificial valve or MVP: no 5. Has a defibrillator: no 6. Has been advised in past to take antibiotics in advance of a procedure like teeth cleaning: no 7. Family history of colon cancer: no  8. Alcohol Use: occasionally 9. History of sleep apnea: no  10. History of coronary artery or other vascular stents placed within the last 12 months: no    MEDICATIONS & ALLERGIES:    Patient reports the following regarding taking any blood thinners:   Plavix? no Aspirin? no Coumadin? no Brilinta? no Xarelto? no Eliquis? no Pradaxa? no Savaysa? no Effient? no  Patient confirms/reports the following medications:  Current Outpatient Prescriptions  Medication Sig Dispense Refill  . pravastatin (PRAVACHOL) 10 MG tablet TAKE 1 TABLET DAILY FOR    CHOLESTEROL 90 tablet 0  . SYNTHROID 50 MCG tablet TAKE 1 TABLET DAILY BEFORE BREAKFAST 90 tablet 1  . ciprofloxacin (CIPRO) 500 MG tablet Take 1 tablet (500 mg total) by mouth 2 (two) times daily. (Patient not taking: Reported on 03/23/2016) 14 tablet 0  . escitalopram (LEXAPRO) 5 MG tablet Take 10 mg by mouth daily.     . phenazopyridine (PYRIDIUM) 200 MG tablet Take 1 tablet (200 mg total) by mouth 3 (three) times daily as needed for pain. (Patient not taking: Reported on 03/23/2016) 10 tablet 0   No current facility-administered medications for this visit.     Patient confirms/reports the following allergies:  No Known Allergies  No orders of the defined types were placed in this encounter.   AUTHORIZATION INFORMATION Primary Insurance:  ID #:  Group #:   Pre-Cert / Auth required:  Pre-Cert / Auth #:   Secondary Insurance:  ID #:  Group #:  Pre-Cert / Auth required:  Pre-Cert / Auth #:   SCHEDULE INFORMATION: Procedure has been scheduled as follows:  Date:              Time:   Location:   This Gastroenterology Pre-Precedure Review Form is being routed to the following provider(s): Barney Drain, MD

## 2016-03-30 NOTE — Telephone Encounter (Signed)
SUPREP SPLIT DOSING-REGULAR breakfast then CLEAR LIQUIDS after 9 am.   

## 2016-04-03 MED ORDER — NA SULFATE-K SULFATE-MG SULF 17.5-3.13-1.6 GM/177ML PO SOLN: 1.0000 | ORAL | 0 refills | Status: DC

## 2016-04-03 NOTE — Telephone Encounter (Signed)
Pt has been scheduled for 04/21/2016 at 10:30 Am with Dr. Oneida Alar. Rx sent to the pharmacy and instructions mailed to pt.

## 2016-04-09 ENCOUNTER — Other Ambulatory Visit: Payer: Self-pay | Admitting: Family Medicine

## 2016-04-11 ENCOUNTER — Telehealth: Payer: Self-pay

## 2016-04-11 NOTE — Telephone Encounter (Signed)
I called [employer] and spoke to [place] who said PA is required for the screening colonoscopy as out patient at [facility].  PA # is [id number].

## 2016-04-14 ENCOUNTER — Other Ambulatory Visit: Payer: Self-pay

## 2016-04-14 DIAGNOSIS — Z1211 Encounter for screening for malignant neoplasm of colon: Secondary | ICD-10-CM

## 2016-04-21 ENCOUNTER — Encounter (HOSPITAL_COMMUNITY): Payer: Self-pay | Admitting: *Deleted

## 2016-04-21 ENCOUNTER — Encounter (HOSPITAL_COMMUNITY)
Admission: RE | Disposition: A | Discharge status: Home / Self Care | Payer: Self-pay | Source: Ambulatory Visit | Attending: Gastroenterology

## 2016-04-21 ENCOUNTER — Ambulatory Visit (HOSPITAL_COMMUNITY)
Admission: RE | Admit: 2016-04-21 | Discharge: 2016-04-21 | Disposition: A | Discharge status: Home / Self Care | Payer: Commercial Managed Care - HMO | Source: Ambulatory Visit | Attending: Gastroenterology | Admitting: Gastroenterology

## 2016-04-21 DIAGNOSIS — E079 Disorder of thyroid, unspecified: Secondary | ICD-10-CM | POA: Diagnosis not present

## 2016-04-21 DIAGNOSIS — F329 Major depressive disorder, single episode, unspecified: Secondary | ICD-10-CM | POA: Insufficient documentation

## 2016-04-21 DIAGNOSIS — K589 Irritable bowel syndrome without diarrhea: Secondary | ICD-10-CM | POA: Diagnosis not present

## 2016-04-21 DIAGNOSIS — Z1212 Encounter for screening for malignant neoplasm of rectum: Secondary | ICD-10-CM | POA: Diagnosis not present

## 2016-04-21 DIAGNOSIS — K219 Gastro-esophageal reflux disease without esophagitis: Secondary | ICD-10-CM | POA: Insufficient documentation

## 2016-04-21 DIAGNOSIS — Z1211 Encounter for screening for malignant neoplasm of colon: Secondary | ICD-10-CM | POA: Diagnosis not present

## 2016-04-21 DIAGNOSIS — E785 Hyperlipidemia, unspecified: Secondary | ICD-10-CM | POA: Diagnosis not present

## 2016-04-21 DIAGNOSIS — Z79899 Other long term (current) drug therapy: Secondary | ICD-10-CM | POA: Insufficient documentation

## 2016-04-21 DIAGNOSIS — Z87891 Personal history of nicotine dependence: Secondary | ICD-10-CM | POA: Diagnosis not present

## 2016-04-21 DIAGNOSIS — Z124 Encounter for screening for malignant neoplasm of cervix: Secondary | ICD-10-CM

## 2016-04-21 HISTORY — PX: COLONOSCOPY: SHX5424

## 2016-04-21 SURGERY — COLONOSCOPY
Anesthesia: Moderate Sedation

## 2016-04-21 MED ORDER — MEPERIDINE HCL 100 MG/ML IJ SOLN
INTRAMUSCULAR | Status: DC | PRN
  Administered 2016-04-21 (×2): 25 mg via INTRAVENOUS

## 2016-04-21 MED ORDER — LIDOCAINE HCL 2 % EX GEL
CUTANEOUS | Status: DC | PRN
  Administered 2016-04-21: 1

## 2016-04-21 MED ORDER — MIDAZOLAM HCL 5 MG/5ML IJ SOLN
INTRAMUSCULAR | Status: DC | PRN
  Administered 2016-04-21 (×5): 2 mg via INTRAVENOUS

## 2016-04-21 MED ORDER — LIDOCAINE HCL 2 % EX GEL
CUTANEOUS | Status: AC
  Filled 2016-04-21: qty 30

## 2016-04-21 MED ORDER — MIDAZOLAM HCL 5 MG/5ML IJ SOLN
INTRAMUSCULAR | Status: AC
  Filled 2016-04-21: qty 10

## 2016-04-21 MED ORDER — SODIUM CHLORIDE 0.9 % IV SOLN: INTRAVENOUS | Status: DC

## 2016-04-21 MED ORDER — MEPERIDINE HCL 100 MG/ML IJ SOLN
INTRAMUSCULAR | Status: AC
  Filled 2016-04-21: qty 2

## 2016-04-21 NOTE — Discharge Instructions (Signed)
You have SMALL internal hemorrhoids. YOU HAVE A FLOPPY LEFT COLON. YOU DID NOT HAVE ANY POLYPS.    DRINK WATER TO KEEP YOUR URINE LIGHT YELLOW.  FOLLOW A HIGH FIBER DIET. AVOID ITEMS THAT CAUSE BLOATING & GAS. SEE INFO BELOW.  USE PREPARATION H FOUR TIMES  A DAY IF NEEDED TO RELIEVE RECTAL PAIN/PRESSURE/BLEEDING.  Next colonoscopy in 10 years.    Colonoscopy Care After Read the instructions outlined below and refer to this sheet in the next week. These discharge instructions provide you with general information on caring for yourself after you leave the hospital. While your treatment has been planned according to the most current medical practices available, unavoidable complications occasionally occur. If you have any problems or questions after discharge, call DR. FIELDS, [phone].  ACTIVITY  You may resume your regular activity, but move at a slower pace for the next 24 hours.   Take frequent rest periods for the next 24 hours.   Walking will help get rid of the air and reduce the bloated feeling in your belly (abdomen).   No driving for 24 hours (because of the medicine (anesthesia) used during the test).   You may shower.   Do not sign any important legal documents or operate any machinery for 24 hours (because of the anesthesia used during the test).    NUTRITION  Drink plenty of fluids.   You may resume your normal diet as instructed by your doctor.   Begin with a light meal and progress to your normal diet. Heavy or fried foods are harder to digest and may make you feel sick to your stomach (nauseated).   Avoid alcoholic beverages for 24 hours or as instructed.    MEDICATIONS  You may resume your normal medications.   WHAT YOU CAN EXPECT TODAY  Some feelings of bloating in the abdomen.   Passage of more gas than usual.   Spotting of blood in your stool or on the toilet paper  .  IF YOU HAD POLYPS REMOVED DURING THE COLONOSCOPY:  Eat a soft diet IF  YOU HAVE NAUSEA, BLOATING, ABDOMINAL PAIN, OR VOMITING.    FINDING OUT THE RESULTS OF YOUR TEST Not all test results are available during your visit. DR. Oneida Alar WILL CALL YOU WITHIN 14 DAYS OF YOUR PROCEDUE WITH YOUR RESULTS. Do not assume everything is normal if you have not heard from DR. FIELDS, CALL HER OFFICE AT [phone].  SEEK IMMEDIATE MEDICAL ATTENTION AND CALL THE OFFICE: [phone] IF:  You have more than a spotting of blood in your stool.   Your belly is swollen (abdominal distention).   You are nauseated or vomiting.   You have a temperature over 101F.   You have abdominal pain or discomfort that is severe or gets worse throughout the day.   High-Fiber Diet A high-fiber diet changes your normal diet to include more whole grains, legumes, fruits, and vegetables. Changes in the diet involve replacing refined carbohydrates with unrefined foods. The calorie level of the diet is essentially unchanged. The Dietary Reference Intake (recommended amount) for adult males is 38 grams per day. For adult females, it is 25 grams per day. Pregnant and lactating women should consume 28 grams of fiber per day. Fiber is the intact part of a plant that is not broken down during digestion. Functional fiber is fiber that has been isolated from the plant to provide a beneficial effect in the body. PURPOSE  Increase stool bulk.   Ease and regulate bowel  movements.   Lower cholesterol.   REDUCE RISK OF COLON CANCER  INDICATIONS THAT YOU NEED MORE FIBER  Constipation and hemorrhoids.   Uncomplicated diverticulosis (intestine condition) and irritable bowel syndrome.   Weight management.   As a protective measure against hardening of the arteries (atherosclerosis), diabetes, and cancer.   GUIDELINES FOR INCREASING FIBER IN THE DIET  Start adding fiber to the diet slowly. A gradual increase of about 5 more grams (2 slices of whole-wheat bread, 2 servings of most fruits or  vegetables, or 1 bowl of high-fiber cereal) per day is best. Too rapid an increase in fiber may result in constipation, flatulence, and bloating.   Drink enough water and fluids to keep your urine clear or pale yellow. Water, juice, or caffeine-free drinks are recommended. Not drinking enough fluid may cause constipation.   Eat a variety of high-fiber foods rather than one type of fiber.   Try to increase your intake of fiber through using high-fiber foods rather than fiber pills or supplements that contain small amounts of fiber.   The goal is to change the types of food eaten. Do not supplement your present diet with high-fiber foods, but replace foods in your present diet.   INCLUDE A VARIETY OF FIBER SOURCES  Replace refined and processed grains with whole grains, canned fruits with fresh fruits, and incorporate other fiber sources. White rice, white breads, and most bakery goods contain little or no fiber.   Brown whole-grain rice, buckwheat oats, and many fruits and vegetables are all good sources of fiber. These include: broccoli, Brussels sprouts, cabbage, cauliflower, beets, sweet potatoes, white potatoes (skin on), carrots, tomatoes, eggplant, squash, berries, fresh fruits, and dried fruits.   Cereals appear to be the richest source of fiber. Cereal fiber is found in whole grains and bran. Bran is the fiber-rich outer coat of cereal grain, which is largely removed in refining. In whole-grain cereals, the bran remains. In breakfast cereals, the largest amount of fiber is found in those with "bran" in their names. The fiber content is sometimes indicated on the label.   You may need to include additional fruits and vegetables each day.   In baking, for 1 cup white flour, you may use the following substitutions:   1 cup whole-wheat flour minus 2 tablespoons.   1/2 cup white flour plus 1/2 cup whole-wheat flour.   Polyps, Colon  A polyp is extra tissue that grows inside your body.  Colon polyps grow in the large intestine. The large intestine, also called the colon, is part of your digestive system. It is a long, hollow tube at the end of your digestive tract where your body makes and stores stool. Most polyps are not dangerous. They are benign. This means they are not cancerous. But over time, some types of polyps can turn into cancer. Polyps that are smaller than a pea are usually not harmful. But larger polyps could someday become or may already be cancerous. To be safe, doctors remove all polyps and test them.   PREVENTION There is not one sure way to prevent polyps. You might be able to lower your risk of getting them if you:  Eat more fruits and vegetables and less fatty food.   Do not smoke.   Avoid alcohol.   Exercise every day.   Lose weight if you are overweight.   Eating more calcium and folate can also lower your risk of getting polyps. Some foods that are rich in calcium are milk,  cheese, and broccoli. Some foods that are rich in folate are chickpeas, kidney beans, and spinach.   Hemorrhoids Hemorrhoids are dilated (enlarged) veins around the rectum. Sometimes clots will form in the veins. This makes them swollen and painful. These are called thrombosed hemorrhoids. Causes of hemorrhoids include:  Constipation.   Straining to have a bowel movement.   HEAVY LIFTING  HOME CARE INSTRUCTIONS  Eat a well balanced diet and drink 6 to 8 glasses of water every day to avoid constipation. You may also use a bulk laxative.   Avoid straining to have bowel movements.   Keep anal area dry and clean.   Do not use a donut shaped pillow or sit on the toilet for long periods. This increases blood pooling and pain.   Move your bowels when your body has the urge; this will require less straining and will decrease pain and pressure.

## 2016-04-21 NOTE — Op Note (Signed)
[facility] Patient Name: Kristy Moon Procedure Date: 04/21/2016 10:26 AM MRN: [id number] Date of Birth: 05-22-1955 Attending MD: Barney Drain , MD CSN: [id number] Age: 60 Admit Type: Outpatient Procedure:                Colonoscopy, SCREENING Indications:              Screening for colorectal malignant neoplasm Providers:                Barney Drain, MD, Janeece Riggers, RN, Randa Spike,                            Technician Referring MD:             Rosemary Holms, MD Medicines:                Meperidine 50 mg IV, Midazolam 10 mg IV Complications:            No immediate complications. Estimated Blood Loss:     Estimated blood loss: none. Procedure:                Pre-Anesthesia Assessment:                           - Prior to the procedure, a History and Physical                            was performed, and patient medications and                            allergies were reviewed. The patient's tolerance of                            previous anesthesia was also reviewed. The risks                            and benefits of the procedure and the sedation                            options and risks were discussed with the patient.                            All questions were answered, and informed consent                            was obtained. Prior Anticoagulants: The patient has                            taken ibuprofen. ASA Grade Assessment: II - A                            patient with mild systemic disease. After reviewing                            the risks and benefits, the patient was deemed in  satisfactory condition to undergo the procedure.                            After obtaining informed consent, the colonoscope                            was passed under direct vision. Throughout the                            procedure, the patient's blood pressure, pulse, and                            oxygen saturations were monitored  continuously. The                            [id number]) scope was introduced through                            the anus and advanced to the the cecum, identified                            by appendiceal orifice and ileocecal valve. The                            ileocecal valve, appendiceal orifice, and rectum                            were photographed. The colonoscopy was somewhat                            difficult due to significant looping, the patient's                            cardiovascular instability (hypotension) and the                            patient's cardiovascular instability (vasovagal                            reaction). Successful completion of the procedure                            was aided by straightening and shortening the scope                            to obtain bowel loop reduction and COLOWRAP. The                            patient tolerated the procedure fairly well. The                            quality of the bowel preparation was excellent. Scope In: [id number] AM Scope Out: 11:28:07 AM Scope Withdrawal Time: 0 hours 8 minutes 16 seconds  Total Procedure Duration: 0  hours 11 minutes 48 seconds  Findings:      The recto-sigmoid colon and sigmoid colon were significantly redundant.      Internal hemorrhoids were found. The hemorrhoids were small. Impression:               - Redundant LEFT colon.                           - Internal hemorrhoids. Moderate Sedation:      Moderate (conscious) sedation was administered by the endoscopy nurse       and supervised by the endoscopist. The following parameters were       monitored: oxygen saturation, heart rate, blood pressure, and response       to care. Total physician intraservice time was 41 minutes. Recommendation:           - High fiber diet.                           - Continue present medications.                           - Repeat colonoscopy in 10 years for surveillance.                            - Patient has a contact number available for                            emergencies. The signs and symptoms of potential                            delayed complications were discussed with the                            patient. Return to normal activities tomorrow.                            Written discharge instructions were provided to the                            patient. Procedure Code(s):        --- Professional ---                           [phone], Colonoscopy, flexible; diagnostic, including                            collection of specimen(s) by brushing or washing,                            when performed (separate procedure)                           99152, Moderate sedation services provided by the                            same physician or other qualified health care  professional performing the diagnostic or                            therapeutic service that the sedation supports,                            requiring the presence of an independent trained                            observer to assist in the monitoring of the                            patient's level of consciousness and physiological                            status; initial 15 minutes of intraservice time,                            patient age 79 years or older                           [phone], Moderate sedation services; each additional                            15 minutes intraservice time                           99153, Moderate sedation services; each additional                            15 minutes intraservice time Diagnosis Code(s):        --- Professional ---                           Z12.11, Encounter for screening for malignant                            neoplasm of colon                           K64.8, Other hemorrhoids                           Q43.8, Other specified congenital malformations of                            intestine CPT copyright  2016 American Medical [employer]. All rights reserved. The codes documented in this report are preliminary and upon coder review may  be revised to meet current compliance requirements. Barney Drain, MD Barney Drain, MD 04/21/2016 11:48:53 AM This report has been signed electronically. Number of Addenda: 0

## 2016-04-21 NOTE — H&P (Signed)
Primary Care Physician:  Mickie Hillier, MD Primary Gastroenterologist:  Dr. Oneida Alar  Pre-Procedure History & Physical: HPI:  Kristy Moon is a 60 y.o. female here for [place].  Past Medical History:  Diagnosis Date  . Depression   . GERD (gastroesophageal reflux disease)   . Hyperlipidemia   . IBS (irritable bowel syndrome)   . Thyroid disease   . Vocal cord nodule     Past Surgical History:  Procedure Laterality Date  . EYE SURGERY Bilateral     Prior to Admission medications   Medication Sig Start Date End Date Taking? Authorizing Provider  Cholecalciferol (VITAMIN D3) 2000 units capsule Take 2,000 Units by mouth daily.   Yes Historical Provider, MD  Doxylamine-Phenylephrine-APAP (VICKS SINEX DAYQUIL/NYQUIL PO) Take 1 Dose by mouth every 4 (four) hours as needed (cold symptoms).   Yes Historical Provider, MD  escitalopram (LEXAPRO) 10 MG tablet Take 10 mg by mouth daily.   Yes Historical Provider, MD  ibuprofen (ADVIL,MOTRIN) 200 MG tablet Take 600 mg by mouth every 4 (four) hours as needed for headache or moderate pain.   Yes Historical Provider, MD  Javier Docker Oil 500 MG CAPS Take 500 mg by mouth daily.   Yes Historical Provider, MD  Melatonin 5 MG TABS Take 5 mg by mouth at bedtime.   Yes Historical Provider, MD  Multiple Vitamin (MULTIVITAMIN WITH MINERALS) TABS tablet Take 1 tablet by mouth daily.   Yes Historical Provider, MD  Multiple Vitamins-Minerals (HAIR SKIN AND NAILS FORMULA) TABS Take 3 tablets by mouth at bedtime.   Yes Historical Provider, MD  Na Sulfate-K Sulfate-Mg Sulf (SUPREP BOWEL PREP KIT) 17.5-3.13-1.6 GM/180ML SOLN Take 1 kit by mouth as directed. 04/03/16  Yes Danie Binder, MD  pravastatin (PRAVACHOL) 10 MG tablet TAKE 1 TABLET DAILY FOR    CHOLESTEROL Patient taking differently: TAKE 1 TABLET DAILY IN THE EVENING FOR CHOLESTEROL 04/10/16  Yes Mikey Kirschner, MD  SYNTHROID 50 MCG tablet TAKE 1 TABLET DAILY BEFORE BREAKFAST 11/25/15  Yes  Nilda Simmer, NP    Allergies as of 04/14/2016  . (No Known Allergies)    Family History  Problem Relation Age of Onset  . Diabetes Other     Social History   Social History  . Marital status: Married    Spouse name: N/A  . Number of children: N/A  . Years of education: N/A   Occupational History  . Not on file.   Social History Main Topics  . Smoking status: Former Smoker    Packs/day: 1.50    Years: 10.00    Types: Cigarettes    Quit date: 03/19/1982  . Smokeless tobacco: Never Used  . Alcohol use No  . Drug use: No  . Sexual activity: Not on file   Other Topics Concern  . Not on file   Social History Narrative  . No narrative on file    Review of Systems: See HPI, otherwise negative ROS   Physical Exam: BP (!) 90/58   Pulse 75   Temp 97.7 F (36.5 C) (Oral)   Resp 17   Ht 5' 7" (1.702 m)   Wt 160 lb (72.6 kg)   SpO2 96%   BMI 25.06 kg/m  General:   Alert,  pleasant and cooperative in NAD Head:  Normocephalic and atraumatic. Neck:  Supple; Lungs:  Clear throughout to auscultation.    Heart:  Regular rate and rhythm. Abdomen:  Soft, nontender and nondistended. Normal bowel sounds, without  guarding, and without rebound.   Neurologic:  Alert and  oriented x4;  grossly normal neurologically.  Impression/Plan:     SCREENING  Plan:  1. TCS TODAY. DISCUSSED PROCEDURE, BENEFITS, & RISKS: < 1% chance of medication reaction, bleeding, perforation, or rupture of spleen/liver.

## 2016-04-25 ENCOUNTER — Encounter (HOSPITAL_COMMUNITY): Payer: Self-pay | Admitting: Gastroenterology

## 2016-05-09 ENCOUNTER — Other Ambulatory Visit: Payer: Self-pay | Admitting: *Deleted

## 2016-05-09 MED ORDER — LEVOTHYROXINE SODIUM 50 MCG PO TABS: 50.0000 ug | ORAL_TABLET | Freq: Every day | ORAL | 1 refills | Status: DC

## 2016-05-31 ENCOUNTER — Other Ambulatory Visit: Payer: Self-pay | Admitting: Nurse Practitioner

## 2016-05-31 ENCOUNTER — Telehealth: Payer: Self-pay | Admitting: Nurse Practitioner

## 2016-05-31 MED ORDER — ESCITALOPRAM OXALATE 10 MG PO TABS: 10.0000 mg | ORAL_TABLET | Freq: Every day | ORAL | 1 refills | Status: DC

## 2016-05-31 NOTE — Telephone Encounter (Signed)
Done

## 2016-05-31 NOTE — Telephone Encounter (Signed)
Patient is requesting a new Rx for escitalopram 10 mg.  She was prescribed 20 mg, but she doesn't need that high of dosage.  She said [facility] told her that when she ran out of her supply she should call in and we could give her a new prescription.   [facility]

## 2016-06-01 DIAGNOSIS — D3611 Benign neoplasm of peripheral nerves and autonomic nervous system of face, head, and neck: Secondary | ICD-10-CM | POA: Diagnosis not present

## 2016-06-01 DIAGNOSIS — D210 Benign neoplasm of connective and other soft tissue of head, face and neck: Secondary | ICD-10-CM | POA: Diagnosis not present

## 2016-06-13 ENCOUNTER — Telehealth: Payer: Self-pay | Admitting: Family Medicine

## 2016-06-13 ENCOUNTER — Other Ambulatory Visit: Payer: Self-pay | Admitting: *Deleted

## 2016-06-13 MED ORDER — PRAVASTATIN SODIUM 10 MG PO TABS: 10.0000 mg | ORAL_TABLET | Freq: Every day | ORAL | 0 refills | Status: DC

## 2016-06-13 MED ORDER — LEVOTHYROXINE SODIUM 50 MCG PO TABS: 50.0000 ug | ORAL_TABLET | Freq: Every day | ORAL | 1 refills | Status: DC

## 2016-06-13 NOTE — Telephone Encounter (Signed)
Pt is needing refills on her pravastatin (PRAVACHOL) 10 MG tablet And  levothyroxine (SYNTHROID) 50 MCG tablet    [facility]

## 2016-06-13 NOTE — Telephone Encounter (Signed)
Refills sent to pharm. Pt notified.  

## 2016-06-15 ENCOUNTER — Other Ambulatory Visit: Payer: Self-pay | Admitting: *Deleted

## 2016-06-15 ENCOUNTER — Telehealth: Payer: Self-pay | Admitting: Family Medicine

## 2016-06-15 MED ORDER — PRAVASTATIN SODIUM 10 MG PO TABS: 10.0000 mg | ORAL_TABLET | Freq: Every day | ORAL | 0 refills | Status: DC

## 2016-06-15 MED ORDER — LEVOTHYROXINE SODIUM 50 MCG PO TABS: 50.0000 ug | ORAL_TABLET | Freq: Every day | ORAL | 1 refills | Status: DC

## 2016-06-15 NOTE — Telephone Encounter (Signed)
meds resent to mail order. Tried to call no answer

## 2016-06-15 NOTE — Telephone Encounter (Signed)
Patient requested Rx for her levothyroxine and pravastatin on Tuesday, but it was sent to [facility].  She is needing [facility].

## 2016-06-15 NOTE — Telephone Encounter (Signed)
Pt.notified

## 2016-08-13 DIAGNOSIS — S5001XA Contusion of right elbow, initial encounter: Secondary | ICD-10-CM | POA: Diagnosis not present

## 2016-10-02 ENCOUNTER — Ambulatory Visit (INDEPENDENT_AMBULATORY_CARE_PROVIDER_SITE_OTHER): Payer: 59 | Admitting: Nurse Practitioner

## 2016-10-02 ENCOUNTER — Encounter: Payer: Self-pay | Admitting: Nurse Practitioner

## 2016-10-02 VITALS — BP 100/60 | Ht 66.0 in | Wt 157.0 lb

## 2016-10-02 DIAGNOSIS — Z124 Encounter for screening for malignant neoplasm of cervix: Secondary | ICD-10-CM | POA: Diagnosis not present

## 2016-10-02 DIAGNOSIS — Z114 Encounter for screening for human immunodeficiency virus [HIV]: Secondary | ICD-10-CM

## 2016-10-02 DIAGNOSIS — Z0000 Encounter for general adult medical examination without abnormal findings: Secondary | ICD-10-CM | POA: Diagnosis not present

## 2016-10-02 DIAGNOSIS — E039 Hypothyroidism, unspecified: Secondary | ICD-10-CM

## 2016-10-02 DIAGNOSIS — Z1151 Encounter for screening for human papillomavirus (HPV): Secondary | ICD-10-CM

## 2016-10-02 DIAGNOSIS — E785 Hyperlipidemia, unspecified: Secondary | ICD-10-CM

## 2016-10-02 DIAGNOSIS — Z1159 Encounter for screening for other viral diseases: Secondary | ICD-10-CM

## 2016-10-02 NOTE — Progress Notes (Signed)
   Subjective:    Patient ID: Kristy Moon, female    DOB: [birth year], 61 y.o.   MRN: [id number]  HPI presents for her wellness exam. No vaginal bleeding or pelvic pain. Same sexual partner. Regular exercise. Regular vision and dental exams.     Review of Systems  Constitutional: Negative for activity change, appetite change and fatigue.  HENT: Negative for dental problem, ear pain, sinus pressure and sore throat.   Respiratory: Negative for cough, chest tightness, shortness of breath and wheezing.   Cardiovascular: Negative for chest pain.  Gastrointestinal: Positive for constipation. Negative for abdominal distention, abdominal pain, diarrhea, nausea and vomiting.       Chronic constipation relieved by use of natural stool softener once every 1-2 weeks.   Genitourinary: Negative for difficulty urinating, dysuria, enuresis, frequency, genital sores, pelvic pain, urgency, vaginal bleeding and vaginal discharge.       Objective:   Physical Exam  Constitutional: She is oriented to person, place, and time. She appears well-developed. No distress.  HENT:  Right Ear: External ear normal.  Left Ear: External ear normal.  Mouth/Throat: Oropharynx is clear and moist.  Neck: Normal range of motion. Neck supple. No tracheal deviation present. No thyromegaly present.  Cardiovascular: Normal rate, regular rhythm and normal heart sounds.  Exam reveals no gallop.   No murmur heard. Pulmonary/Chest: Effort normal and breath sounds normal.  Abdominal: Soft. She exhibits no distension. There is no tenderness.  Genitourinary: Vagina normal and uterus normal. No vaginal discharge found.  Genitourinary Comments: External GU: no rashes or lesions. Vagina: slightly pale, no discharge. No CMT. Bimanual exam: no tenderness or obvious masses.   Musculoskeletal: She exhibits no edema.  Lymphadenopathy:    She has no cervical adenopathy.  Neurological: She is alert and oriented to person, place, and  time.  Skin: Skin is warm and dry. No rash noted.  Psychiatric: She has a normal mood and affect. Her behavior is normal.  Vitals reviewed. Breast exam: no masses. Axillae no adenopathy.         Assessment & Plan:  Annual physical exam - Plan: Pap IG and HPV (high risk) DNA detection, CBC with Differential/Platelet, Basic metabolic panel, Lipid panel, Hepatic function panel, TSH, HIV antibody, Hepatitis C antibody  Screening for cervical cancer - Plan: Pap IG and HPV (high risk) DNA detection  Screening for HPV (human papillomavirus) - Plan: Pap IG and HPV (high risk) DNA detection  Hypothyroidism, unspecified type - Plan: TSH  Hyperlipidemia, unspecified hyperlipidemia type - Plan: Lipid panel  Need for hepatitis C screening test - Plan: Hepatitis C antibody  Encounter for screening for HIV - Plan: HIV antibody  Encouraged continued activity and healthy diet. Labs pending. Patient plans to schedule her own mammogram. Also one time HIV and Hep C screening ordered per guidelines.  Return in about 1 year (around 10/02/2017) for physical.

## 2016-10-04 DIAGNOSIS — E785 Hyperlipidemia, unspecified: Secondary | ICD-10-CM | POA: Diagnosis not present

## 2016-10-04 DIAGNOSIS — Z1159 Encounter for screening for other viral diseases: Secondary | ICD-10-CM | POA: Diagnosis not present

## 2016-10-04 DIAGNOSIS — Z0000 Encounter for general adult medical examination without abnormal findings: Secondary | ICD-10-CM | POA: Diagnosis not present

## 2016-10-04 DIAGNOSIS — E039 Hypothyroidism, unspecified: Secondary | ICD-10-CM | POA: Diagnosis not present

## 2016-10-04 LAB — PAP IG AND HPV HIGH-RISK
HPV, high-risk: NEGATIVE
PAP Smear Comment: 0

## 2016-10-05 LAB — LIPID PANEL
CHOL/HDL RATIO: 3.5 ratio (ref 0.0–4.4)
CHOLESTEROL TOTAL: 183 mg/dL (ref 100–199)
HDL: 53 mg/dL (ref >39)
LDL CALC: 116 mg/dL — AB (ref 0–99)
Triglycerides: 71 mg/dL (ref 0–149)
VLDL Cholesterol Cal: 14 mg/dL (ref 5–40)

## 2016-10-05 LAB — TSH: TSH: 3.91 u[IU]/mL (ref 0.450–4.500)

## 2016-10-05 LAB — CBC WITH DIFFERENTIAL/PLATELET
BASOS ABS: 0 10*3/uL (ref 0.0–0.2)
Basos: 0 %
EOS (ABSOLUTE): 0.1 10*3/uL (ref 0.0–0.4)
EOS: 3 %
HEMATOCRIT: 41.4 % (ref 34.0–46.6)
HEMOGLOBIN: 13.8 g/dL (ref 11.1–15.9)
IMMATURE GRANULOCYTES: 0 %
Immature Grans (Abs): 0 10*3/uL (ref 0.0–0.1)
LYMPHS: 30 %
Lymphocytes Absolute: 1.3 10*3/uL (ref 0.7–3.1)
MCH: 31.6 pg (ref 26.6–33.0)
MCHC: 33.3 g/dL (ref 31.5–35.7)
MCV: 95 fL (ref 79–97)
MONOCYTES: 9 %
Monocytes Absolute: 0.4 10*3/uL (ref 0.1–0.9)
NEUTROS PCT: 58 %
Neutrophils Absolute: 2.5 10*3/uL (ref 1.4–7.0)
Platelets: 287 10*3/uL (ref 150–379)
RBC: 4.37 x10E6/uL (ref 3.77–5.28)
RDW: 13.1 % (ref 12.3–15.4)
WBC: 4.4 10*3/uL (ref 3.4–10.8)

## 2016-10-05 LAB — HEPATIC FUNCTION PANEL
ALT: 23 IU/L (ref 0–32)
AST: 24 IU/L (ref 0–40)
Albumin: 4.3 g/dL (ref 3.6–4.8)
Alkaline Phosphatase: 57 IU/L (ref 39–117)
BILIRUBIN TOTAL: 0.5 mg/dL (ref 0.0–1.2)
BILIRUBIN, DIRECT: 0.08 mg/dL (ref 0.00–0.40)
Total Protein: 6.8 g/dL (ref 6.0–8.5)

## 2016-10-05 LAB — BASIC METABOLIC PANEL
BUN/Creatinine Ratio: 14 (ref 12–28)
BUN: 10 mg/dL (ref 8–27)
CALCIUM: 9.4 mg/dL (ref 8.7–10.3)
CO2: 24 mmol/L (ref 20–29)
CREATININE: 0.74 mg/dL (ref 0.57–1.00)
Chloride: 103 mmol/L (ref 96–106)
GFR calc Af Amer: 102 mL/min/{1.73_m2} (ref >59)
GFR, EST NON AFRICAN AMERICAN: 88 mL/min/{1.73_m2} (ref >59)
GLUCOSE: 85 mg/dL (ref 65–99)
POTASSIUM: 4.9 mmol/L (ref 3.5–5.2)
SODIUM: 140 mmol/L (ref 134–144)

## 2016-10-05 LAB — HEPATITIS C ANTIBODY: Hep C Virus Ab: <0.1 s/co ratio (ref 0.0–0.9)

## 2016-10-05 LAB — HIV ANTIBODY (ROUTINE TESTING W REFLEX): HIV Screen 4th Generation wRfx: NONREACTIVE

## 2016-10-06 NOTE — Progress Notes (Signed)
[facility] 10/06/16

## 2016-10-09 DIAGNOSIS — M7501 Adhesive capsulitis of right shoulder: Secondary | ICD-10-CM | POA: Diagnosis not present

## 2016-10-09 DIAGNOSIS — M25511 Pain in right shoulder: Secondary | ICD-10-CM | POA: Diagnosis not present

## 2016-10-14 ENCOUNTER — Other Ambulatory Visit: Payer: Self-pay | Admitting: Family Medicine

## 2016-10-23 MED ORDER — LEVOTHYROXINE SODIUM 50 MCG PO TABS: ORAL_TABLET | ORAL | 1 refills | Status: DC

## 2016-10-23 NOTE — Addendum Note (Signed)
Addended by: Dairl Ponder on: 10/23/2016 11:02 AM   Modules accepted: Orders

## 2016-11-11 ENCOUNTER — Other Ambulatory Visit: Payer: Self-pay | Admitting: Nurse Practitioner

## 2016-11-11 ENCOUNTER — Other Ambulatory Visit: Payer: Self-pay | Admitting: Family Medicine

## 2017-03-22 ENCOUNTER — Other Ambulatory Visit: Payer: Self-pay | Admitting: Family Medicine

## 2017-05-16 ENCOUNTER — Other Ambulatory Visit: Payer: Self-pay | Admitting: Nurse Practitioner

## 2017-05-16 NOTE — Telephone Encounter (Signed)
Call pt, need s li p and liv and tsh and o v with myself or carolyn, need to have 6 mo visit devoted to her chol and thyr and antidepr meds, if agrees may ref

## 2017-06-09 ENCOUNTER — Other Ambulatory Visit: Payer: Self-pay | Admitting: Family Medicine

## 2017-06-15 ENCOUNTER — Other Ambulatory Visit: Payer: Self-pay | Admitting: Nurse Practitioner

## 2017-06-30 ENCOUNTER — Other Ambulatory Visit: Payer: Self-pay | Admitting: Family Medicine

## 2017-07-23 ENCOUNTER — Other Ambulatory Visit: Payer: Self-pay | Admitting: Family Medicine

## 2017-08-16 ENCOUNTER — Other Ambulatory Visit: Payer: Self-pay | Admitting: Nurse Practitioner

## 2017-09-04 ENCOUNTER — Other Ambulatory Visit: Payer: Self-pay | Admitting: *Deleted

## 2017-09-04 MED ORDER — ESCITALOPRAM OXALATE 10 MG PO TABS: ORAL_TABLET | ORAL | 0 refills | Status: DC

## 2017-10-05 ENCOUNTER — Ambulatory Visit (INDEPENDENT_AMBULATORY_CARE_PROVIDER_SITE_OTHER): Payer: 59 | Admitting: Nurse Practitioner

## 2017-10-05 ENCOUNTER — Encounter: Payer: Self-pay | Admitting: Nurse Practitioner

## 2017-10-05 VITALS — BP 122/70 | HR 72 | Ht 66.0 in | Wt 161.4 lb

## 2017-10-05 DIAGNOSIS — Z1231 Encounter for screening mammogram for malignant neoplasm of breast: Secondary | ICD-10-CM

## 2017-10-05 DIAGNOSIS — Z1239 Encounter for other screening for malignant neoplasm of breast: Secondary | ICD-10-CM

## 2017-10-05 DIAGNOSIS — Z780 Asymptomatic menopausal state: Secondary | ICD-10-CM

## 2017-10-05 DIAGNOSIS — Z01419 Encounter for gynecological examination (general) (routine) without abnormal findings: Secondary | ICD-10-CM

## 2017-10-05 DIAGNOSIS — Z0000 Encounter for general adult medical examination without abnormal findings: Secondary | ICD-10-CM

## 2017-10-05 DIAGNOSIS — E039 Hypothyroidism, unspecified: Secondary | ICD-10-CM | POA: Diagnosis not present

## 2017-10-05 DIAGNOSIS — E785 Hyperlipidemia, unspecified: Secondary | ICD-10-CM | POA: Diagnosis not present

## 2017-10-06 LAB — BASIC METABOLIC PANEL
BUN / CREAT RATIO: 19 (ref 12–28)
BUN: 15 mg/dL (ref 8–27)
CALCIUM: 9.9 mg/dL (ref 8.7–10.3)
CHLORIDE: 103 mmol/L (ref 96–106)
CO2: 24 mmol/L (ref 20–29)
Creatinine, Ser: 0.8 mg/dL (ref 0.57–1.00)
GFR, EST AFRICAN AMERICAN: 92 mL/min/{1.73_m2} (ref >59)
GFR, EST NON AFRICAN AMERICAN: 80 mL/min/{1.73_m2} (ref >59)
Glucose: 79 mg/dL (ref 65–99)
Potassium: 5.4 mmol/L — ABNORMAL HIGH (ref 3.5–5.2)
SODIUM: 141 mmol/L (ref 134–144)

## 2017-10-06 LAB — HEPATIC FUNCTION PANEL
ALBUMIN: 4.6 g/dL (ref 3.6–4.8)
ALK PHOS: 66 IU/L (ref 39–117)
ALT: 21 IU/L (ref 0–32)
AST: 23 IU/L (ref 0–40)
BILIRUBIN TOTAL: 0.6 mg/dL (ref 0.0–1.2)
BILIRUBIN, DIRECT: 0.14 mg/dL (ref 0.00–0.40)
Total Protein: 7.2 g/dL (ref 6.0–8.5)

## 2017-10-06 LAB — LIPID PANEL
CHOL/HDL RATIO: 3.4 ratio (ref 0.0–4.4)
Cholesterol, Total: 205 mg/dL — ABNORMAL HIGH (ref 100–199)
HDL: 61 mg/dL (ref >39)
LDL Calculated: 131 mg/dL — ABNORMAL HIGH (ref 0–99)
Triglycerides: 67 mg/dL (ref 0–149)
VLDL Cholesterol Cal: 13 mg/dL (ref 5–40)

## 2017-10-06 LAB — TSH: TSH: 4.22 u[IU]/mL (ref 0.450–4.500)

## 2017-10-06 LAB — VITAMIN D 25 HYDROXY (VIT D DEFICIENCY, FRACTURES): VIT D 25 HYDROXY: 56.7 ng/mL (ref 30.0–100.0)

## 2017-10-08 ENCOUNTER — Encounter: Payer: Self-pay | Admitting: Nurse Practitioner

## 2017-10-08 MED ORDER — ACYCLOVIR 5 % EX CREA: 1.0000 "application " | TOPICAL_CREAM | CUTANEOUS | 0 refills | Status: DC

## 2017-10-08 NOTE — Progress Notes (Signed)
Subjective:    Patient ID: Kristy Moon, female    DOB: 1955/06/15, 62 y.o.   MRN: [id number]  HPI Presents for her wellness exam. No vaginal bleeding or pelvic pain. Same sexual partner. Regular activity with walking. Regular vision and dental exams. Has a history of type I genital herpes with very rare outbreak. Would like topical cream on hand if needed. Defers depression screening today. Has been under unusual stress with family issues.     Review of Systems  Constitutional: Positive for fatigue. Negative for activity change and appetite change.  HENT: Negative for dental problem, ear pain, sinus pressure and sore throat.   Respiratory: Negative for cough, chest tightness, shortness of breath and wheezing.   Cardiovascular: Negative for chest pain.  Gastrointestinal: Negative for abdominal distention, abdominal pain, blood in stool, constipation, diarrhea, nausea and vomiting.  Genitourinary: Negative for difficulty urinating, dysuria, enuresis, frequency, genital sores, pelvic pain, urgency, vaginal bleeding and vaginal discharge.  Psychiatric/Behavioral: The patient is nervous/anxious.        Objective:   Physical Exam  Constitutional: She is oriented to person, place, and time. She appears well-developed. No distress.  HENT:  Right Ear: External ear normal.  Left Ear: External ear normal.  Mouth/Throat: Oropharynx is clear and moist.  Neck: Normal range of motion. Neck supple. No tracheal deviation present. No thyromegaly present.  Cardiovascular: Normal rate, regular rhythm and normal heart sounds. Exam reveals no gallop.  No murmur heard. Pulmonary/Chest: Effort normal and breath sounds normal. Right breast exhibits no inverted nipple, no mass, no skin change and no tenderness. Left breast exhibits no inverted nipple, no mass, no skin change and no tenderness. Breasts are symmetrical.  Axillae no adenopathy.   Abdominal: Soft. She exhibits no distension. There is no  tenderness.  Genitourinary: Vagina normal and uterus normal. No vaginal discharge found.  Genitourinary Comments: External GU: pale, no rashes or lesions. Vagina: no discharge. Cervix normal in appearance. No CMT. Bimanual exam: no tenderness or obvious masses.   Musculoskeletal: She exhibits no edema.  Lymphadenopathy:    She has no cervical adenopathy.  Neurological: She is alert and oriented to person, place, and time.  Skin: Skin is warm and dry. No rash noted.  Psychiatric: She has a normal mood and affect. Her behavior is normal.          Assessment & Plan:   Problem List Items Addressed This Visit      Endocrine   Hypothyroidism   Relevant Orders   TSH (Completed)     Other   Hyperlipemia   Relevant Orders   Lipid Profile (Completed)    Other Visit Diagnoses    Well woman exam    -  Primary   Relevant Orders   Basic Metabolic Panel (BMET) (Completed)   Lipid Profile (Completed)   Hepatic function panel (Completed)   Vitamin D (25 hydroxy) (Completed)   TSH (Completed)   Screening for breast cancer       Relevant Orders   MM DIGITAL SCREENING BILATERAL   Post-menopausal       Relevant Orders   DG Bone Density     Meds ordered this encounter  Medications  . acyclovir cream (ZOVIRAX) 5 %    Sig: Apply 1 application topically every 4 (four) hours. Prn outbreak    Dispense:  15 g    Refill:  0    Order Specific Question:   Supervising Provider    Answer:   Baltazar Apo  S [[id number]]   Encouraged continued activity, healthy diet and daily vitamin D supplement. Defers changes in her medication; continue Lexapro as directed. Labs pending.  Return in about 1 year (around 10/06/2018) for physical.

## 2017-10-09 ENCOUNTER — Other Ambulatory Visit: Payer: Self-pay | Admitting: Nurse Practitioner

## 2017-10-09 DIAGNOSIS — E875 Hyperkalemia: Secondary | ICD-10-CM

## 2017-10-15 ENCOUNTER — Encounter: Payer: Self-pay | Admitting: Nurse Practitioner

## 2017-10-15 ENCOUNTER — Ambulatory Visit (HOSPITAL_COMMUNITY)
Admission: RE | Admit: 2017-10-15 | Discharge: 2017-10-15 | Disposition: A | Discharge status: Home / Self Care | Payer: 59 | Source: Ambulatory Visit | Attending: Nurse Practitioner | Admitting: Nurse Practitioner

## 2017-10-15 DIAGNOSIS — M47816 Spondylosis without myelopathy or radiculopathy, lumbar region: Secondary | ICD-10-CM | POA: Diagnosis not present

## 2017-10-15 DIAGNOSIS — Z780 Asymptomatic menopausal state: Secondary | ICD-10-CM | POA: Diagnosis present

## 2017-10-15 DIAGNOSIS — Z1231 Encounter for screening mammogram for malignant neoplasm of breast: Secondary | ICD-10-CM | POA: Insufficient documentation

## 2017-10-15 DIAGNOSIS — M8588 Other specified disorders of bone density and structure, other site: Secondary | ICD-10-CM | POA: Insufficient documentation

## 2017-10-15 DIAGNOSIS — M8580 Other specified disorders of bone density and structure, unspecified site: Secondary | ICD-10-CM | POA: Insufficient documentation

## 2017-10-16 ENCOUNTER — Other Ambulatory Visit: Payer: Self-pay | Admitting: Nurse Practitioner

## 2017-10-16 DIAGNOSIS — R921 Mammographic calcification found on diagnostic imaging of breast: Secondary | ICD-10-CM

## 2017-10-23 ENCOUNTER — Ambulatory Visit (HOSPITAL_COMMUNITY)
Admission: RE | Admit: 2017-10-23 | Discharge: 2017-10-23 | Disposition: A | Discharge status: Home / Self Care | Payer: 59 | Source: Ambulatory Visit | Attending: Nurse Practitioner | Admitting: Nurse Practitioner

## 2017-10-23 ENCOUNTER — Encounter (HOSPITAL_COMMUNITY): Payer: Self-pay

## 2017-10-23 DIAGNOSIS — R921 Mammographic calcification found on diagnostic imaging of breast: Secondary | ICD-10-CM | POA: Diagnosis present

## 2017-11-27 ENCOUNTER — Other Ambulatory Visit: Payer: Self-pay | Admitting: Nurse Practitioner

## 2017-12-17 ENCOUNTER — Other Ambulatory Visit: Payer: Self-pay

## 2017-12-17 MED ORDER — LEVOTHYROXINE SODIUM 50 MCG PO TABS: ORAL_TABLET | ORAL | 1 refills | Status: DC

## 2017-12-17 MED ORDER — PRAVASTATIN SODIUM 10 MG PO TABS: ORAL_TABLET | ORAL | 0 refills | Status: DC

## 2018-02-11 ENCOUNTER — Ambulatory Visit: Payer: 59 | Admitting: Family Medicine

## 2018-02-11 ENCOUNTER — Encounter: Payer: Self-pay | Admitting: Family Medicine

## 2018-02-11 VITALS — BP 108/68 | Temp 98.6°F | Ht 66.0 in | Wt 166.4 lb

## 2018-02-11 DIAGNOSIS — L02212 Cutaneous abscess of back [any part, except buttock]: Secondary | ICD-10-CM

## 2018-02-11 MED ORDER — DOXYCYCLINE HYCLATE 100 MG PO TABS: ORAL_TABLET | ORAL | 0 refills | Status: DC

## 2018-02-11 NOTE — Progress Notes (Signed)
   Subjective:    Patient ID: Kristy Moon, female    DOB: 1956-02-16, 62 y.o.   MRN: [id number]  HPIBoil on back. Came up about one and a half weeks ago. Painful. Has not tried any treatments.     Review of Systems No headache, no major weight loss or weight gain, no chest pain no back pain abdominal pain no change in bowel habits complete ROS otherwise negative     Objective:   Physical Exam Alert vitals stable, NAD. Blood pressure good on repeat. HEENT normal. Lungs clear. Heart regular rate and rhythm. Impressive boil on back mid thorax  Patient was prepped draped and anesthetized incised large amount of discharge.  Inserted with measures discussed over the coming days.  Antibiotics prescribed.       Assessment & Plan:

## 2018-03-02 ENCOUNTER — Other Ambulatory Visit: Payer: Self-pay | Admitting: Family Medicine

## 2018-03-22 ENCOUNTER — Other Ambulatory Visit: Payer: Self-pay | Admitting: Family Medicine

## 2018-03-25 ENCOUNTER — Telehealth: Payer: Self-pay | Admitting: Family Medicine

## 2018-03-25 NOTE — Telephone Encounter (Signed)
Patient having left wrist pain going into her thumb and would like an appt to get steroid injection in her joint like she has in her shoulder before.

## 2018-03-25 NOTE — Telephone Encounter (Signed)
On top of (L) wrist pain from thumb, pt would like to have injection placed as she did in her shoulder/(R) wrist had in the past. Advise.

## 2018-03-25 NOTE — Telephone Encounter (Signed)
This appointment should be with Dr. Richardson Landry next week

## 2018-03-28 NOTE — Telephone Encounter (Signed)
Appt scheduled Dec. 11th, 2019 1:10pm, pt aware.

## 2018-04-03 ENCOUNTER — Ambulatory Visit: Payer: 59 | Admitting: Family Medicine

## 2018-04-08 DIAGNOSIS — M654 Radial styloid tenosynovitis [de Quervain]: Secondary | ICD-10-CM | POA: Diagnosis not present

## 2018-05-20 ENCOUNTER — Telehealth: Payer: Self-pay | Admitting: Family Medicine

## 2018-05-20 ENCOUNTER — Other Ambulatory Visit: Payer: Self-pay | Admitting: Family Medicine

## 2018-05-20 DIAGNOSIS — E785 Hyperlipidemia, unspecified: Secondary | ICD-10-CM

## 2018-05-20 DIAGNOSIS — Z79899 Other long term (current) drug therapy: Secondary | ICD-10-CM

## 2018-05-20 NOTE — Telephone Encounter (Signed)
Pharmacy faxed over paper stating that patient would like 90 day supply on Pravastatin 10 mg; Escitalopram 10 mg, and Levothyroxine 50 mcg. Please advise. Thank you.

## 2018-05-20 NOTE — Addendum Note (Signed)
Addended by: Karle Barr on: 05/20/2018 09:36 AM   Modules accepted: Orders

## 2018-05-20 NOTE — Telephone Encounter (Signed)
Patient is aware of all. Labs placed in the system.Pt has an appointment 05/22/2017. Please advise regarding her medications.

## 2018-05-20 NOTE — Telephone Encounter (Signed)
Call pt, she needs lipid and liver tests plus o v, tell her should be seen twice pr yr for her chronic concerns.

## 2018-05-21 ENCOUNTER — Other Ambulatory Visit: Payer: Self-pay

## 2018-05-21 MED ORDER — PRAVASTATIN SODIUM 10 MG PO TABS: 10.0000 mg | ORAL_TABLET | Freq: Every day | ORAL | 0 refills | Status: DC

## 2018-05-21 MED ORDER — LEVOTHYROXINE SODIUM 50 MCG PO TABS: ORAL_TABLET | ORAL | 1 refills | Status: DC

## 2018-05-21 MED ORDER — ESCITALOPRAM OXALATE 10 MG PO TABS: 10.0000 mg | ORAL_TABLET | Freq: Every day | ORAL | 0 refills | Status: DC

## 2018-05-21 NOTE — Telephone Encounter (Signed)
Ok times one 

## 2018-05-21 NOTE — Telephone Encounter (Signed)
Medications sent in.

## 2018-05-22 ENCOUNTER — Ambulatory Visit: Payer: BLUE CROSS/BLUE SHIELD | Admitting: Family Medicine

## 2018-05-22 ENCOUNTER — Encounter: Payer: Self-pay | Admitting: Family Medicine

## 2018-05-22 VITALS — BP 118/76 | Ht 66.0 in | Wt 165.8 lb

## 2018-05-22 DIAGNOSIS — Z79899 Other long term (current) drug therapy: Secondary | ICD-10-CM | POA: Diagnosis not present

## 2018-05-22 DIAGNOSIS — F329 Major depressive disorder, single episode, unspecified: Secondary | ICD-10-CM

## 2018-05-22 DIAGNOSIS — E785 Hyperlipidemia, unspecified: Secondary | ICD-10-CM

## 2018-05-22 DIAGNOSIS — F32A Depression, unspecified: Secondary | ICD-10-CM

## 2018-05-22 DIAGNOSIS — E039 Hypothyroidism, unspecified: Secondary | ICD-10-CM

## 2018-05-22 DIAGNOSIS — E875 Hyperkalemia: Secondary | ICD-10-CM

## 2018-05-22 MED ORDER — PRAVASTATIN SODIUM 10 MG PO TABS: 10.0000 mg | ORAL_TABLET | Freq: Every day | ORAL | 1 refills | Status: DC

## 2018-05-22 MED ORDER — ESCITALOPRAM OXALATE 10 MG PO TABS: 10.0000 mg | ORAL_TABLET | Freq: Every day | ORAL | 1 refills | Status: DC

## 2018-05-22 MED ORDER — LEVOTHYROXINE SODIUM 50 MCG PO TABS: ORAL_TABLET | ORAL | 1 refills | Status: DC

## 2018-05-22 NOTE — Progress Notes (Signed)
   Subjective:    Patient ID: Kristy Moon, female    DOB: 10-Nov-1955, 63 y.o.   MRN: [id number] Patient arrives office with numerous concerns Hyperlipidemia  This is a chronic problem. The current episode started more than 1 year ago. Treatments tried: pravachol. There are no compliance problems.  Risk factors for coronary artery disease include dyslipidemia and post-menopausal.   Patient continues to take lipid medication regularly. No obvious side effects from it. Generally does not miss a dose. Prior blood work results are reviewed with patient. Patient continues to work on fat intake in diet  Patient notes ongoing compliance with antidepressant medication. No obvious side effects. Reports does not miss a dose. Overall continues to help depression substantially. No thoughts of homicide or suicide. Would like to maintain medication.  Having challenges with both mom's, doing a lot of the cre taking  A lot of wear and tear  Had  Gr nephew, was killed,, undre    Daughter in [place], went and visited    Pt got sick nd passed out on the plane     g fa fmaily ha stroke hx   Patient continues to take lipid medication regularly. No obvious side effects from it. Generally does not miss a dose. Prior blood work results are reviewed with patient. Patient continues to work on fat intake in diet  Thyroid express scripts getting the thy through then  '  No symptoms of high or low thyroid.  TSH 6 months ago.  Had a high potassium over half a year ago.  Did not follow-up and get potassium blood work as requested.  Will evaluate    Passed out after vomiting spell while on   exrcising off and    Chol med, diet not the best,,eats out a lot  And not the healthiest    Review of Systems No headache, no major weight loss or weight gain, no chest pain no back pain abdominal pain no change in bowel habits complete ROS otherwise negative     Objective:   Physical Exam Alert and  oriented, vitals reviewed and stable, NAD ENT-TM's and ext canals WNL bilat via otoscopic exam Soft palate, tonsils and post pharynx WNL via oropharyngeal exam Neck-symmetric, no masses; thyroid nonpalpable and nontender Pulmonary-no tachypnea or accessory muscle use; Clear without wheezes via auscultation Card--no abnrml murmurs, rhythm reg and rate WNL Carotid pulses symmetric, without bruits        Assessment & Plan:  Impression 1 hyperlipidemia.  Current status uncertain.  Appropriate blood work ordered.  Compliance discussed medications refilled.  Diet discussed patient to work  2.  Depression.  Clinically stable but patient feels definitely needs medication.  A lot of stress within the family with 2 elderly mother is medium clear and other stressors in the family structure see PHQ 9  3.  Hypothyroidism ongoing with compliance meds refilled  4.  Hyperkalemia status uncertain needs blood work  Appropriate blood work ordered.  Medications refilled diet exercise discussed recheck in 6 months for wellness plus chronic

## 2018-05-23 DIAGNOSIS — E785 Hyperlipidemia, unspecified: Secondary | ICD-10-CM | POA: Diagnosis not present

## 2018-05-23 DIAGNOSIS — Z79899 Other long term (current) drug therapy: Secondary | ICD-10-CM | POA: Diagnosis not present

## 2018-05-24 LAB — BASIC METABOLIC PANEL
BUN/Creatinine Ratio: 9 — ABNORMAL LOW (ref 12–28)
BUN: 7 mg/dL — ABNORMAL LOW (ref 8–27)
CO2: 25 mmol/L (ref 20–29)
CREATININE: 0.8 mg/dL (ref 0.57–1.00)
Calcium: 9.6 mg/dL (ref 8.7–10.3)
Chloride: 103 mmol/L (ref 96–106)
GFR calc Af Amer: 92 mL/min/{1.73_m2} (ref >59)
GFR calc non Af Amer: 80 mL/min/{1.73_m2} (ref >59)
GLUCOSE: 85 mg/dL (ref 65–99)
Potassium: 4.3 mmol/L (ref 3.5–5.2)
Sodium: 143 mmol/L (ref 134–144)

## 2018-05-24 LAB — HEPATIC FUNCTION PANEL
ALT: 21 IU/L (ref 0–32)
AST: 24 IU/L (ref 0–40)
Albumin: 4.4 g/dL (ref 3.8–4.8)
Alkaline Phosphatase: 63 IU/L (ref 39–117)
Bilirubin Total: 0.5 mg/dL (ref 0.0–1.2)
Bilirubin, Direct: 0.13 mg/dL (ref 0.00–0.40)
Total Protein: 7 g/dL (ref 6.0–8.5)

## 2018-05-24 LAB — LIPID PANEL
Chol/HDL Ratio: 3.2 ratio (ref 0.0–4.4)
Cholesterol, Total: 196 mg/dL (ref 100–199)
HDL: 61 mg/dL (ref >39)
LDL Calculated: 120 mg/dL — ABNORMAL HIGH (ref 0–99)
Triglycerides: 75 mg/dL (ref 0–149)
VLDL Cholesterol Cal: 15 mg/dL (ref 5–40)

## 2018-05-26 ENCOUNTER — Encounter: Payer: Self-pay | Admitting: Family Medicine

## 2018-10-15 ENCOUNTER — Encounter: Payer: Self-pay | Admitting: Family Medicine

## 2018-10-15 ENCOUNTER — Other Ambulatory Visit: Payer: Self-pay

## 2018-10-15 ENCOUNTER — Ambulatory Visit (INDEPENDENT_AMBULATORY_CARE_PROVIDER_SITE_OTHER): Payer: BC Managed Care – PPO | Admitting: Family Medicine

## 2018-10-15 ENCOUNTER — Ambulatory Visit (HOSPITAL_COMMUNITY)
Admission: RE | Admit: 2018-10-15 | Discharge: 2018-10-15 | Disposition: A | Discharge status: Home / Self Care | Payer: BC Managed Care – PPO | Source: Ambulatory Visit | Attending: Family Medicine | Admitting: Family Medicine

## 2018-10-15 DIAGNOSIS — S299XXA Unspecified injury of thorax, initial encounter: Secondary | ICD-10-CM | POA: Diagnosis not present

## 2018-10-15 DIAGNOSIS — M25512 Pain in left shoulder: Secondary | ICD-10-CM

## 2018-10-15 DIAGNOSIS — R0609 Other forms of dyspnea: Secondary | ICD-10-CM | POA: Diagnosis not present

## 2018-10-15 DIAGNOSIS — R0789 Other chest pain: Secondary | ICD-10-CM | POA: Insufficient documentation

## 2018-10-15 DIAGNOSIS — R1013 Epigastric pain: Secondary | ICD-10-CM

## 2018-10-15 DIAGNOSIS — S40012A Contusion of left shoulder, initial encounter: Secondary | ICD-10-CM | POA: Insufficient documentation

## 2018-10-15 DIAGNOSIS — R0781 Pleurodynia: Secondary | ICD-10-CM | POA: Diagnosis not present

## 2018-10-15 DIAGNOSIS — R110 Nausea: Secondary | ICD-10-CM | POA: Diagnosis not present

## 2018-10-15 MED ORDER — PANTOPRAZOLE SODIUM 40 MG PO TBEC: 40.0000 mg | DELAYED_RELEASE_TABLET | Freq: Every day | ORAL | 5 refills | Status: DC

## 2018-10-15 NOTE — Progress Notes (Signed)
Subjective:    Patient ID: Kristy Moon, female    DOB: 1955-04-25, 63 y.o.   MRN: [id number]  HPI [place] about 3 weeks ago. Tripped out in the woods and fell on left side. States she did not hit her head. Tightness in abdomen and goes around to her back. Pt states she is not having pain but tenderness and discomfort on left side. Started having nausea about 4 days ago.  Patient states that when she fell she took a sizable hit to her left side she was doing relatively well until recently She states she was sore painful with rotation bending flexing but that is starting to improve She does state that recently she had noticed getting out of breath with minimal activity feeling nauseated feeling tight in her chest that radiates into her back she denies numbness or tingling down the arm denies any passing out spells she does have risk factors for heart disease but her HDL is very good Virtual Visit via Video Note  I connected with Kristy Moon on 10/15/18 at 10:00 AM EDT by a video enabled telemedicine application and verified that I am speaking with the correct person using two identifiers.  Location: Patient: home Provider: office   I discussed the limitations of evaluation and management by telemedicine and the availability of in person appointments. The patient expressed understanding and agreed to proceed.  History of Present Illness:   This visit started off as a virtual visit but then I brought her in in order to examine her and do EKG Observations/Objective:   Assessment and Plan:   Follow Up Instructions:    I discussed the assessment and treatment plan with the patient. The patient was provided an opportunity to ask questions and all were answered. The patient agreed with the plan and demonstrated an understanding of the instructions.   The patient was advised to call back or seek an in-person evaluation if the symptoms worsen or if the condition fails to improve as  anticipated.  I provided 25 minutes of non-face-to-face time during this encounter.   EKG no acute changes no previous EKG to compare against   Review of Systems  Constitutional: Negative for activity change, appetite change and fatigue.  HENT: Negative for congestion and rhinorrhea.   Respiratory: Positive for shortness of breath. Negative for cough.   Cardiovascular: Positive for chest pain. Negative for leg swelling.  Gastrointestinal: Negative for abdominal pain and diarrhea.  Endocrine: Negative for polydipsia and polyphagia.  Skin: Negative for color change.  Neurological: Negative for dizziness and weakness.  Psychiatric/Behavioral: Negative for behavioral problems and confusion.       Objective:   Physical Exam Vitals signs reviewed.  Constitutional:      General: She is not in acute distress. HENT:     Head: Normocephalic and atraumatic.  Eyes:     General:        Right eye: No discharge.        Left eye: No discharge.  Neck:     Trachea: No tracheal deviation.  Cardiovascular:     Rate and Rhythm: Normal rate and regular rhythm.     Heart sounds: Normal heart sounds. No murmur.  Pulmonary:     Effort: Pulmonary effort is normal. No respiratory distress.     Breath sounds: Normal breath sounds.  Abdominal:     General: Abdomen is flat.     Palpations: Abdomen is soft.  Lymphadenopathy:     Cervical: No cervical  adenopathy.  Skin:    General: Skin is warm and dry.  Neurological:     Mental Status: She is alert.     Coordination: Coordination normal.  Psychiatric:        Behavior: Behavior normal.     25 minutes was spent with the patient.  This statement verifies that 25 minutes was indeed spent with the patient.  More than 50% of this visit-total duration of the visit-was spent in counseling and coordination of care. The issues that the patient came in for today as reflected in the diagnosis (s) please refer to documentation for further details.        Assessment & Plan:  She has left shoulder tenderness rib discomfort at the axillary line on the left side her lungs are clear heart is regular I would recommend x-rays to rule out rib fracture and pleural effusion as well as shoulder fracture more than likely x-rays will be normal and she will gradually get better  DOE along with some chest tightness urgent cardiology consult hopefully they can get her in relatively soon to do a stress test to rule out underlying heart disease may need echo as well warning signs discussed follow-up if problems

## 2018-10-16 LAB — BASIC METABOLIC PANEL
BUN/Creatinine Ratio: 15 (ref 12–28)
BUN: 11 mg/dL (ref 8–27)
CO2: 23 mmol/L (ref 20–29)
Calcium: 9.8 mg/dL (ref 8.7–10.3)
Chloride: 104 mmol/L (ref 96–106)
Creatinine, Ser: 0.72 mg/dL (ref 0.57–1.00)
GFR calc Af Amer: 104 mL/min/{1.73_m2} (ref >59)
GFR calc non Af Amer: 90 mL/min/{1.73_m2} (ref >59)
Glucose: 83 mg/dL (ref 65–99)
Potassium: 5.7 mmol/L — ABNORMAL HIGH (ref 3.5–5.2)
Sodium: 141 mmol/L (ref 134–144)

## 2018-10-16 LAB — HEPATIC FUNCTION PANEL
ALT: 25 IU/L (ref 0–32)
AST: 26 IU/L (ref 0–40)
Albumin: 4.6 g/dL (ref 3.8–4.8)
Alkaline Phosphatase: 75 IU/L (ref 39–117)
Bilirubin Total: 0.5 mg/dL (ref 0.0–1.2)
Bilirubin, Direct: 0.12 mg/dL (ref 0.00–0.40)
Total Protein: 6.9 g/dL (ref 6.0–8.5)

## 2018-10-16 LAB — CBC WITH DIFFERENTIAL/PLATELET
Basophils Absolute: 0 10*3/uL (ref 0.0–0.2)
Basos: 0 %
EOS (ABSOLUTE): 0.1 10*3/uL (ref 0.0–0.4)
Eos: 2 %
Hematocrit: 40.8 % (ref 34.0–46.6)
Hemoglobin: 13.7 g/dL (ref 11.1–15.9)
Immature Grans (Abs): 0 10*3/uL (ref 0.0–0.1)
Immature Granulocytes: 0 %
Lymphocytes Absolute: 1.6 10*3/uL (ref 0.7–3.1)
Lymphs: 28 %
MCH: 31.4 pg (ref 26.6–33.0)
MCHC: 33.6 g/dL (ref 31.5–35.7)
MCV: 93 fL (ref 79–97)
Monocytes Absolute: 0.5 10*3/uL (ref 0.1–0.9)
Monocytes: 8 %
Neutrophils Absolute: 3.5 10*3/uL (ref 1.4–7.0)
Neutrophils: 62 %
Platelets: 294 10*3/uL (ref 150–450)
RBC: 4.37 x10E6/uL (ref 3.77–5.28)
RDW: 12.1 % (ref 11.7–15.4)
WBC: 5.7 10*3/uL (ref 3.4–10.8)

## 2018-10-16 LAB — LIPASE: Lipase: 40 U/L (ref 14–72)

## 2018-10-17 ENCOUNTER — Encounter: Payer: Self-pay | Admitting: Family Medicine

## 2018-10-31 ENCOUNTER — Other Ambulatory Visit: Payer: Self-pay | Admitting: Family Medicine

## 2018-10-31 NOTE — Telephone Encounter (Signed)
Six mo 

## 2018-11-04 DIAGNOSIS — M7502 Adhesive capsulitis of left shoulder: Secondary | ICD-10-CM | POA: Diagnosis not present

## 2018-11-04 DIAGNOSIS — M654 Radial styloid tenosynovitis [de Quervain]: Secondary | ICD-10-CM | POA: Diagnosis not present

## 2018-11-05 DIAGNOSIS — E559 Vitamin D deficiency, unspecified: Secondary | ICD-10-CM | POA: Diagnosis not present

## 2018-11-05 DIAGNOSIS — M2550 Pain in unspecified joint: Secondary | ICD-10-CM | POA: Diagnosis not present

## 2018-11-05 DIAGNOSIS — R5383 Other fatigue: Secondary | ICD-10-CM | POA: Diagnosis not present

## 2018-11-06 DIAGNOSIS — M6281 Muscle weakness (generalized): Secondary | ICD-10-CM | POA: Diagnosis not present

## 2018-11-06 DIAGNOSIS — M654 Radial styloid tenosynovitis [de Quervain]: Secondary | ICD-10-CM | POA: Diagnosis not present

## 2018-11-06 DIAGNOSIS — M25532 Pain in left wrist: Secondary | ICD-10-CM | POA: Diagnosis not present

## 2018-11-06 DIAGNOSIS — M25512 Pain in left shoulder: Secondary | ICD-10-CM | POA: Diagnosis not present

## 2018-11-11 DIAGNOSIS — M6281 Muscle weakness (generalized): Secondary | ICD-10-CM | POA: Diagnosis not present

## 2018-11-11 DIAGNOSIS — M25532 Pain in left wrist: Secondary | ICD-10-CM | POA: Diagnosis not present

## 2018-11-11 DIAGNOSIS — M25512 Pain in left shoulder: Secondary | ICD-10-CM | POA: Diagnosis not present

## 2018-11-11 DIAGNOSIS — M654 Radial styloid tenosynovitis [de Quervain]: Secondary | ICD-10-CM | POA: Diagnosis not present

## 2018-11-12 DIAGNOSIS — M25512 Pain in left shoulder: Secondary | ICD-10-CM | POA: Diagnosis not present

## 2018-11-12 DIAGNOSIS — M799 Soft tissue disorder, unspecified: Secondary | ICD-10-CM | POA: Diagnosis not present

## 2018-11-12 DIAGNOSIS — M25612 Stiffness of left shoulder, not elsewhere classified: Secondary | ICD-10-CM | POA: Diagnosis not present

## 2018-11-12 DIAGNOSIS — M6281 Muscle weakness (generalized): Secondary | ICD-10-CM | POA: Diagnosis not present

## 2018-11-19 DIAGNOSIS — M6281 Muscle weakness (generalized): Secondary | ICD-10-CM | POA: Diagnosis not present

## 2018-11-19 DIAGNOSIS — M654 Radial styloid tenosynovitis [de Quervain]: Secondary | ICD-10-CM | POA: Diagnosis not present

## 2018-11-19 DIAGNOSIS — M799 Soft tissue disorder, unspecified: Secondary | ICD-10-CM | POA: Diagnosis not present

## 2018-11-19 DIAGNOSIS — M25612 Stiffness of left shoulder, not elsewhere classified: Secondary | ICD-10-CM | POA: Diagnosis not present

## 2018-11-19 DIAGNOSIS — M25532 Pain in left wrist: Secondary | ICD-10-CM | POA: Diagnosis not present

## 2018-11-19 DIAGNOSIS — M25512 Pain in left shoulder: Secondary | ICD-10-CM | POA: Diagnosis not present

## 2018-11-22 DIAGNOSIS — M25532 Pain in left wrist: Secondary | ICD-10-CM | POA: Diagnosis not present

## 2018-11-22 DIAGNOSIS — M799 Soft tissue disorder, unspecified: Secondary | ICD-10-CM | POA: Diagnosis not present

## 2018-11-22 DIAGNOSIS — M6281 Muscle weakness (generalized): Secondary | ICD-10-CM | POA: Diagnosis not present

## 2018-11-22 DIAGNOSIS — M654 Radial styloid tenosynovitis [de Quervain]: Secondary | ICD-10-CM | POA: Diagnosis not present

## 2018-11-22 DIAGNOSIS — M25512 Pain in left shoulder: Secondary | ICD-10-CM | POA: Diagnosis not present

## 2018-11-22 DIAGNOSIS — M25612 Stiffness of left shoulder, not elsewhere classified: Secondary | ICD-10-CM | POA: Diagnosis not present

## 2018-11-25 DIAGNOSIS — D72829 Elevated white blood cell count, unspecified: Secondary | ICD-10-CM | POA: Diagnosis not present

## 2018-11-25 DIAGNOSIS — M654 Radial styloid tenosynovitis [de Quervain]: Secondary | ICD-10-CM | POA: Diagnosis not present

## 2018-11-25 DIAGNOSIS — M7989 Other specified soft tissue disorders: Secondary | ICD-10-CM | POA: Diagnosis not present

## 2018-11-25 DIAGNOSIS — R768 Other specified abnormal immunological findings in serum: Secondary | ICD-10-CM | POA: Diagnosis not present

## 2018-11-27 DIAGNOSIS — M25612 Stiffness of left shoulder, not elsewhere classified: Secondary | ICD-10-CM | POA: Diagnosis not present

## 2018-11-27 DIAGNOSIS — M799 Soft tissue disorder, unspecified: Secondary | ICD-10-CM | POA: Diagnosis not present

## 2018-11-27 DIAGNOSIS — M25512 Pain in left shoulder: Secondary | ICD-10-CM | POA: Diagnosis not present

## 2018-11-27 DIAGNOSIS — M25532 Pain in left wrist: Secondary | ICD-10-CM | POA: Diagnosis not present

## 2018-11-27 DIAGNOSIS — M6281 Muscle weakness (generalized): Secondary | ICD-10-CM | POA: Diagnosis not present

## 2018-11-27 DIAGNOSIS — M654 Radial styloid tenosynovitis [de Quervain]: Secondary | ICD-10-CM | POA: Diagnosis not present

## 2018-11-29 DIAGNOSIS — M6281 Muscle weakness (generalized): Secondary | ICD-10-CM | POA: Diagnosis not present

## 2018-11-29 DIAGNOSIS — M654 Radial styloid tenosynovitis [de Quervain]: Secondary | ICD-10-CM | POA: Diagnosis not present

## 2018-11-29 DIAGNOSIS — M25512 Pain in left shoulder: Secondary | ICD-10-CM | POA: Diagnosis not present

## 2018-11-29 DIAGNOSIS — M25612 Stiffness of left shoulder, not elsewhere classified: Secondary | ICD-10-CM | POA: Diagnosis not present

## 2018-11-29 DIAGNOSIS — M799 Soft tissue disorder, unspecified: Secondary | ICD-10-CM | POA: Diagnosis not present

## 2018-11-29 DIAGNOSIS — M25532 Pain in left wrist: Secondary | ICD-10-CM | POA: Diagnosis not present

## 2018-12-04 DIAGNOSIS — M25532 Pain in left wrist: Secondary | ICD-10-CM | POA: Diagnosis not present

## 2018-12-04 DIAGNOSIS — M25612 Stiffness of left shoulder, not elsewhere classified: Secondary | ICD-10-CM | POA: Diagnosis not present

## 2018-12-04 DIAGNOSIS — M6281 Muscle weakness (generalized): Secondary | ICD-10-CM | POA: Diagnosis not present

## 2018-12-04 DIAGNOSIS — M25512 Pain in left shoulder: Secondary | ICD-10-CM | POA: Diagnosis not present

## 2018-12-04 DIAGNOSIS — M799 Soft tissue disorder, unspecified: Secondary | ICD-10-CM | POA: Diagnosis not present

## 2018-12-04 DIAGNOSIS — M654 Radial styloid tenosynovitis [de Quervain]: Secondary | ICD-10-CM | POA: Diagnosis not present

## 2018-12-06 DIAGNOSIS — M25512 Pain in left shoulder: Secondary | ICD-10-CM | POA: Diagnosis not present

## 2018-12-06 DIAGNOSIS — M25532 Pain in left wrist: Secondary | ICD-10-CM | POA: Diagnosis not present

## 2018-12-06 DIAGNOSIS — M6281 Muscle weakness (generalized): Secondary | ICD-10-CM | POA: Diagnosis not present

## 2018-12-06 DIAGNOSIS — M654 Radial styloid tenosynovitis [de Quervain]: Secondary | ICD-10-CM | POA: Diagnosis not present

## 2018-12-06 DIAGNOSIS — M25612 Stiffness of left shoulder, not elsewhere classified: Secondary | ICD-10-CM | POA: Diagnosis not present

## 2018-12-06 DIAGNOSIS — M799 Soft tissue disorder, unspecified: Secondary | ICD-10-CM | POA: Diagnosis not present

## 2018-12-09 ENCOUNTER — Encounter: Payer: Self-pay | Admitting: Cardiology

## 2018-12-09 DIAGNOSIS — M25612 Stiffness of left shoulder, not elsewhere classified: Secondary | ICD-10-CM | POA: Diagnosis not present

## 2018-12-09 DIAGNOSIS — M25512 Pain in left shoulder: Secondary | ICD-10-CM | POA: Diagnosis not present

## 2018-12-09 DIAGNOSIS — M6281 Muscle weakness (generalized): Secondary | ICD-10-CM | POA: Diagnosis not present

## 2018-12-09 DIAGNOSIS — M799 Soft tissue disorder, unspecified: Secondary | ICD-10-CM | POA: Diagnosis not present

## 2018-12-09 DIAGNOSIS — M654 Radial styloid tenosynovitis [de Quervain]: Secondary | ICD-10-CM | POA: Diagnosis not present

## 2018-12-09 DIAGNOSIS — M25532 Pain in left wrist: Secondary | ICD-10-CM | POA: Diagnosis not present

## 2018-12-09 NOTE — Progress Notes (Signed)
Cardiology Office Note  Date: 12/10/2018   ID: Kristy Moon, DOB 1956-02-14, MRN [id number]  PCP:  Mikey Kirschner, MD  Consulting Cardiologist:  Satira Sark, MD Electrophysiologist:  None   Chief Complaint  Patient presents with  . Shortness of Breath    History of Present Illness: Kristy Moon is a 63 y.o. female referred for cardiology consultation by Dr. Wolfgang Phoenix for the evaluation of dyspnea on exertion and chest discomfort.  She presents describing a few different symptoms.  She states that she had experienced a fall and was taking ibuprofen at higher than recommended doses.  She started to have a feeling of chest and epigastric discomfort and also frequent belching.  She stopped using ibuprofen and was placed on some type of antacid by Dr. Wolfgang Phoenix for a month, states that the symptoms completely resolved.  Unrelated to this however she has also been experiencing increasing dyspnea on exertion within the last 6 months and exertional fatigue.  She has a trail on her property and enjoys walking, usually anywhere between 3 and 5 miles at a time.  More recently however she has not been able to complete this distance without stopping a few times to rest.  She has a remote history of tobacco use, also personal history of hyperlipidemia for which she takes Pravachol.  There is no obvious family history of premature cardiovascular disease.  She has not undergone any prior cardiac testing.   Past Medical History:  Diagnosis Date  . Depression   . GERD (gastroesophageal reflux disease)   . Hyperlipidemia   . Hypothyroidism   . IBS (irritable bowel syndrome)   . Vocal cord nodule     Past Surgical History:  Procedure Laterality Date  . COLONOSCOPY N/A 04/21/2016   Procedure: COLONOSCOPY;  Surgeon: Danie Binder, MD;  Location: [facility];  Service: Endoscopy;  Laterality: N/A;  10:30 Am  . EYE SURGERY Bilateral     Current Outpatient Medications  Medication Sig  Dispense Refill  . acyclovir cream (ZOVIRAX) 5 % Apply 1 application topically every 4 (four) hours. Prn outbreak 15 g 0  . celecoxib (CELEBREX) 200 MG capsule Take 200 mg by mouth daily.     . Cholecalciferol (VITAMIN D3) 2000 units capsule Take 2,000 Units by mouth daily.    Marland Kitchen escitalopram (LEXAPRO) 10 MG tablet TAKE 1 TABLET DAILY 90 tablet 1  . ibuprofen (ADVIL,MOTRIN) 200 MG tablet Take 600 mg by mouth every 4 (four) hours as needed for headache or moderate pain.    Javier Docker Oil 500 MG CAPS Take 500 mg by mouth daily.    Marland Kitchen levothyroxine (SYNTHROID) 50 MCG tablet TAKE 1 TABLET DAILY BEFORE BREAKFAST 90 tablet 1  . Melatonin 5 MG TABS Take 5 mg by mouth at bedtime.    . pantoprazole (PROTONIX) 40 MG tablet Take 1 tablet (40 mg total) by mouth daily. 30 tablet 5  . pravastatin (PRAVACHOL) 10 MG tablet TAKE 1 TABLET DAILY FOR CHOLESTEROL 90 tablet 1   No current facility-administered medications for this visit.    Allergies:  Patient has no known allergies.   Social History: The patient  reports that she quit smoking about 36 years ago. Her smoking use included cigarettes. She has a 15.00 pack-year smoking history. She has never used smokeless tobacco. She reports that she does not drink alcohol or use drugs.   Family History: The patient's family history includes Diabetes in an other family member.  ROS:  Please see the history of present illness. Otherwise, complete review of systems is positive for none.  All other systems are reviewed and negative.   Physical Exam: VS:  BP 132/74   Pulse 74   Temp 98.6 F (37 C)   Ht 5\' 7"  (1.702 m)   Wt 171 lb (77.6 kg)   BMI 26.78 kg/m , BMI Body mass index is 26.78 kg/m.  Wt Readings from Last 3 Encounters:  12/10/18 171 lb (77.6 kg)  05/22/18 165 lb 12.8 oz (75.2 kg)  02/11/18 166 lb 6.4 oz (75.5 kg)    General: Patient appears comfortable at rest. HEENT: Conjunctiva and lids normal, wearing a mask. Neck: Supple, no elevated JVP or  carotid bruits, no thyromegaly. Lungs: Clear to auscultation, nonlabored breathing at rest. Cardiac: Regular rate and rhythm, no S3 or significant systolic murmur. Abdomen: Soft, nontender, bowel sounds present, no guarding or rebound. Extremities: No pitting edema, distal pulses 2+. Skin: Warm and dry. Musculoskeletal: No kyphosis. Neuropsychiatric: Alert and oriented x3, affect grossly appropriate.  ECG:  An ECG dated 10/15/2018 was personally reviewed today and demonstrated:  Sinus bradycardia.  Recent Labwork: 10/15/2018: ALT 25; AST 26; BUN 11; Creatinine, Ser 0.72; Hemoglobin 13.7; Platelets 294; Potassium 5.7; Sodium 141     Component Value Date/Time   CHOL 196 05/23/2018 1020   TRIG 75 05/23/2018 1020   HDL 61 05/23/2018 1020   CHOLHDL 3.2 05/23/2018 1020   CHOLHDL 3.7 11/13/2014 1112   VLDL 20 11/13/2014 1112   LDLCALC 120 (H) 05/23/2018 1020    Other Studies Reviewed Today:  Chest x-ray 10/15/2018: FINDINGS: Mediastinum hilar structures normal. Lungs are clear. No pleural effusion or pneumothorax. No evidence of displaced rib fracture.  IMPRESSION: No acute abnormality identified.  Rib films 10/15/2018: FINDINGS: Two views of the left ribs. Normal thoracic segmentation. No displaced left rib fracture. No nondisplaced fracture is evident. The left lung appears clear, no left pneumothorax. Other visible osseous structures appear grossly intact with negative visible bowel gas pattern.  IMPRESSION: No left rib fracture identified.  Left shoulder films 10/15/2018: FINDINGS: Acromioclavicular and glenohumeral degenerative change. No acute bony or joint abnormality identified.  IMPRESSION: No acute abnormality.  Assessment and Plan:  1.  Dyspnea on exertion as well as exertional fatigue, most notable within the last 6 months.  Primary cardiac risk factors are age and mixed hyperlipidemia.  I reviewed her recent ECG which showed sinus bradycardia.  She has not  undergone any prior ischemic testing.  We will arrange a [place] for further assessment.  2.  Mixed hyperlipidemia, on Pravachol.  Last LDL was 120.  She follows with Dr. Wolfgang Phoenix.   Medication Adjustments/Labs and Tests Ordered: Current medicines are reviewed at length with the patient today.  Concerns regarding medicines are outlined above.   Tests Ordered: Orders Placed This Encounter  Procedures  . NM Myocar Multi W/Spect W/Wall Motion / EF    Medication Changes: No orders of the defined types were placed in this encounter.   Disposition:  Follow up test results.  Signed, Satira Sark, MD, [facility] 12/10/2018 2:01 PM    [facility] at [facility] [address] [place], [place] [address] Phone: [phone]; Fax: [phone]

## 2018-12-10 ENCOUNTER — Encounter: Payer: Self-pay | Admitting: Cardiology

## 2018-12-10 ENCOUNTER — Other Ambulatory Visit: Payer: Self-pay

## 2018-12-10 ENCOUNTER — Ambulatory Visit (INDEPENDENT_AMBULATORY_CARE_PROVIDER_SITE_OTHER): Payer: BC Managed Care – PPO | Admitting: Cardiology

## 2018-12-10 VITALS — BP 132/74 | HR 74 | Temp 98.6°F | Ht 67.0 in | Wt 171.0 lb

## 2018-12-10 DIAGNOSIS — R0609 Other forms of dyspnea: Secondary | ICD-10-CM | POA: Diagnosis not present

## 2018-12-10 DIAGNOSIS — E782 Mixed hyperlipidemia: Secondary | ICD-10-CM | POA: Diagnosis not present

## 2018-12-10 NOTE — Patient Instructions (Signed)
Medication Instructions: Your physician recommends that you continue on your current medications as directed. Please refer to the Current Medication list given to you today.  Labwork: None today  Procedures/Testing: Your physician has requested that you have a lexiscan myoview. For further information please visit [email or web address]. Please follow instruction sheet, as given.    Follow-Up:  We will call you with results  Any Additional Special Instructions Will Be Listed Below (If Applicable).     If you need a refill on your cardiac medications before your next appointment, please call your pharmacy.      Thank you for choosing [facility] !

## 2018-12-12 ENCOUNTER — Ambulatory Visit: Payer: BC Managed Care – PPO | Admitting: Cardiology

## 2018-12-13 DIAGNOSIS — M654 Radial styloid tenosynovitis [de Quervain]: Secondary | ICD-10-CM | POA: Diagnosis not present

## 2018-12-13 DIAGNOSIS — M799 Soft tissue disorder, unspecified: Secondary | ICD-10-CM | POA: Diagnosis not present

## 2018-12-13 DIAGNOSIS — M25532 Pain in left wrist: Secondary | ICD-10-CM | POA: Diagnosis not present

## 2018-12-13 DIAGNOSIS — M25512 Pain in left shoulder: Secondary | ICD-10-CM | POA: Diagnosis not present

## 2018-12-13 DIAGNOSIS — M25612 Stiffness of left shoulder, not elsewhere classified: Secondary | ICD-10-CM | POA: Diagnosis not present

## 2018-12-13 DIAGNOSIS — M6281 Muscle weakness (generalized): Secondary | ICD-10-CM | POA: Diagnosis not present

## 2018-12-17 ENCOUNTER — Encounter (HOSPITAL_BASED_OUTPATIENT_CLINIC_OR_DEPARTMENT_OTHER)
Admission: RE | Admit: 2018-12-17 | Discharge: 2018-12-17 | Disposition: A | Discharge status: Home / Self Care | Payer: BC Managed Care – PPO | Source: Ambulatory Visit | Attending: Cardiology | Admitting: Cardiology

## 2018-12-17 ENCOUNTER — Other Ambulatory Visit: Payer: Self-pay

## 2018-12-17 ENCOUNTER — Encounter (HOSPITAL_COMMUNITY)
Admission: RE | Admit: 2018-12-17 | Discharge: 2018-12-17 | Disposition: A | Discharge status: Home / Self Care | Payer: BC Managed Care – PPO | Source: Ambulatory Visit | Attending: Cardiology | Admitting: Cardiology

## 2018-12-17 DIAGNOSIS — R0609 Other forms of dyspnea: Secondary | ICD-10-CM

## 2018-12-17 LAB — NM MYOCAR MULTI W/SPECT W/WALL MOTION / EF
LV dias vol: 62 mL (ref 46–106)
LV sys vol: 25 mL
Peak HR: 87 {beats}/min
RATE: 0.3
Rest HR: 56 {beats}/min
SDS: 1
SRS: 0
SSS: 1
TID: 1.15

## 2018-12-17 MED ORDER — REGADENOSON 0.4 MG/5ML IV SOLN
INTRAVENOUS | Status: AC
  Administered 2018-12-17: 0.4 mg via INTRAVENOUS
  Filled 2018-12-17: qty 5

## 2018-12-17 MED ORDER — SODIUM CHLORIDE 0.9% FLUSH
INTRAVENOUS | Status: AC
  Administered 2018-12-17: 10 mL via INTRAVENOUS
  Filled 2018-12-17: qty 10

## 2018-12-17 MED ORDER — TECHNETIUM TC 99M TETROFOSMIN IV KIT
10.0000 | PACK | Freq: Once | INTRAVENOUS | Status: AC | PRN
  Administered 2018-12-17: 11 via INTRAVENOUS

## 2018-12-17 MED ORDER — TECHNETIUM TC 99M TETROFOSMIN IV KIT
30.0000 | PACK | Freq: Once | INTRAVENOUS | Status: AC | PRN
  Administered 2018-12-17: 32.7 via INTRAVENOUS

## 2018-12-18 ENCOUNTER — Other Ambulatory Visit (HOSPITAL_COMMUNITY): Payer: Self-pay | Admitting: Family Medicine

## 2018-12-18 DIAGNOSIS — Z1231 Encounter for screening mammogram for malignant neoplasm of breast: Secondary | ICD-10-CM

## 2019-01-01 DIAGNOSIS — D72829 Elevated white blood cell count, unspecified: Secondary | ICD-10-CM | POA: Diagnosis not present

## 2019-01-01 DIAGNOSIS — M0589 Other rheumatoid arthritis with rheumatoid factor of multiple sites: Secondary | ICD-10-CM | POA: Diagnosis not present

## 2019-01-01 DIAGNOSIS — M7989 Other specified soft tissue disorders: Secondary | ICD-10-CM | POA: Diagnosis not present

## 2019-01-01 DIAGNOSIS — Z23 Encounter for immunization: Secondary | ICD-10-CM | POA: Diagnosis not present

## 2019-01-01 DIAGNOSIS — R768 Other specified abnormal immunological findings in serum: Secondary | ICD-10-CM | POA: Diagnosis not present

## 2019-01-06 ENCOUNTER — Other Ambulatory Visit: Payer: Self-pay

## 2019-01-06 ENCOUNTER — Ambulatory Visit (HOSPITAL_COMMUNITY)
Admission: RE | Admit: 2019-01-06 | Discharge: 2019-01-06 | Disposition: A | Discharge status: Home / Self Care | Payer: BC Managed Care – PPO | Source: Ambulatory Visit | Attending: Family Medicine | Admitting: Family Medicine

## 2019-01-06 DIAGNOSIS — Z1231 Encounter for screening mammogram for malignant neoplasm of breast: Secondary | ICD-10-CM

## 2019-03-31 DIAGNOSIS — M0589 Other rheumatoid arthritis with rheumatoid factor of multiple sites: Secondary | ICD-10-CM | POA: Diagnosis not present

## 2019-03-31 DIAGNOSIS — Z6827 Body mass index (BMI) 27.0-27.9, adult: Secondary | ICD-10-CM | POA: Diagnosis not present

## 2019-03-31 DIAGNOSIS — M79645 Pain in left finger(s): Secondary | ICD-10-CM | POA: Diagnosis not present

## 2019-03-31 DIAGNOSIS — R768 Other specified abnormal immunological findings in serum: Secondary | ICD-10-CM | POA: Diagnosis not present

## 2019-04-29 ENCOUNTER — Other Ambulatory Visit: Payer: Self-pay | Admitting: Family Medicine

## 2019-04-29 NOTE — Telephone Encounter (Signed)
Please schedule and then route back to nurses to send in refills thanks

## 2019-04-29 NOTE — Telephone Encounter (Signed)
Call pt, sched virtual visit in feb, may then ref times three mo

## 2019-04-29 NOTE — Telephone Encounter (Signed)
Scheduled 2/9 

## 2019-04-29 NOTE — Telephone Encounter (Signed)
Last med check 05/22/18

## 2019-05-14 DIAGNOSIS — H2513 Age-related nuclear cataract, bilateral: Secondary | ICD-10-CM | POA: Diagnosis not present

## 2019-05-14 DIAGNOSIS — H35362 Drusen (degenerative) of macula, left eye: Secondary | ICD-10-CM | POA: Diagnosis not present

## 2019-05-29 ENCOUNTER — Encounter: Payer: Self-pay | Admitting: Family Medicine

## 2019-05-30 DIAGNOSIS — H25812 Combined forms of age-related cataract, left eye: Secondary | ICD-10-CM | POA: Diagnosis not present

## 2019-06-02 DIAGNOSIS — M0589 Other rheumatoid arthritis with rheumatoid factor of multiple sites: Secondary | ICD-10-CM | POA: Diagnosis not present

## 2019-06-02 DIAGNOSIS — M654 Radial styloid tenosynovitis [de Quervain]: Secondary | ICD-10-CM | POA: Diagnosis not present

## 2019-06-02 DIAGNOSIS — M79645 Pain in left finger(s): Secondary | ICD-10-CM | POA: Diagnosis not present

## 2019-06-03 ENCOUNTER — Other Ambulatory Visit: Payer: Self-pay

## 2019-06-03 ENCOUNTER — Ambulatory Visit (INDEPENDENT_AMBULATORY_CARE_PROVIDER_SITE_OTHER): Payer: BC Managed Care – PPO | Admitting: Family Medicine

## 2019-06-03 DIAGNOSIS — E039 Hypothyroidism, unspecified: Secondary | ICD-10-CM | POA: Diagnosis not present

## 2019-06-03 DIAGNOSIS — Z79899 Other long term (current) drug therapy: Secondary | ICD-10-CM

## 2019-06-03 DIAGNOSIS — E785 Hyperlipidemia, unspecified: Secondary | ICD-10-CM

## 2019-06-03 MED ORDER — PRAVASTATIN SODIUM 10 MG PO TABS: 10.0000 mg | ORAL_TABLET | Freq: Every day | ORAL | 1 refills | Status: DC

## 2019-06-03 MED ORDER — LEVOTHYROXINE SODIUM 50 MCG PO TABS: 50.0000 ug | ORAL_TABLET | Freq: Every day | ORAL | 1 refills | Status: DC

## 2019-06-03 MED ORDER — ESCITALOPRAM OXALATE 10 MG PO TABS: 10.0000 mg | ORAL_TABLET | Freq: Every day | ORAL | 1 refills | Status: DC

## 2019-06-03 MED ORDER — PANTOPRAZOLE SODIUM 40 MG PO TBEC: 40.0000 mg | DELAYED_RELEASE_TABLET | Freq: Every day | ORAL | 1 refills | Status: DC

## 2019-06-03 NOTE — Progress Notes (Signed)
   Subjective:    Patient ID: Kristy Moon, female    DOB: 1955-11-27, 64 y.o.   MRN: [id number]  HPI Pt here for medication check. Pt states that she was diagnosed with Rheumatoid Arthritis over the summer and is on Celebrex 200 mg and Leflunomide 20 mg daily. Pt also had one cataract removed last week and will have another one removed on the 19th. No issues or concerns at this time.  Virtual Visit via Telephone Note  I connected with Kristy Moon on 06/03/19 at  2:30 PM EST by telephone and verified that I am speaking with the correct person using two identifiers.  Location: Patient: home Provider: office   I discussed the limitations, risks, security and privacy concerns of performing an evaluation and management service by telephone and the availability of in person appointments. I also discussed with the patient that there may be a patient responsible charge related to this service. The patient expressed understanding and agreed to proceed.   History of Present Illness:    Observations/Objective:   Assessment and Plan:   Follow Up Instructions:    I discussed the assessment and treatment plan with the patient. The patient was provided an opportunity to ask questions and all were answered. The patient agreed with the plan and demonstrated an understanding of the instructions.   The patient was advised to call back or seek an in-person evaluation if the symptoms worsen or if the condition fails to improve as anticipated.  I provided23 minutes of non-face-to-face time during this encounter.   Patient continues to take lipid medication regularly. No obvious side effects from it. Generally does not miss a dose. Prior blood work results are reviewed with patient. Patient continues to work on fat intake in diet  Compliant with thyroid medication.  No symptoms of high or low thyroid.  Currently followed by specialist for rheumatoid arthritis.  States overall doing well  with this.  Exercising regularly    Review of Systems No headache, no major weight loss or weight gain, no chest pain no back pain abdominal pain no change in bowel habits complete ROS otherwise negative     Objective:   Physical Exam  Virtual      Assessment & Plan:  Impression hyperlipidemia status uncertain discussed appropriate blood work maintain same meds for now diet discussed  2.  Hypothyroidism medications refilled appropriate blood work ordered  Follow-up in 6 months diet exercise discussed further recommendations based on results

## 2019-06-11 DIAGNOSIS — H2511 Age-related nuclear cataract, right eye: Secondary | ICD-10-CM | POA: Diagnosis not present

## 2019-06-13 DIAGNOSIS — H25811 Combined forms of age-related cataract, right eye: Secondary | ICD-10-CM | POA: Diagnosis not present

## 2019-06-30 DIAGNOSIS — E039 Hypothyroidism, unspecified: Secondary | ICD-10-CM | POA: Diagnosis not present

## 2019-06-30 DIAGNOSIS — Z79899 Other long term (current) drug therapy: Secondary | ICD-10-CM | POA: Diagnosis not present

## 2019-06-30 DIAGNOSIS — E785 Hyperlipidemia, unspecified: Secondary | ICD-10-CM | POA: Diagnosis not present

## 2019-07-01 LAB — TSH: TSH: 6.69 u[IU]/mL — ABNORMAL HIGH (ref 0.450–4.500)

## 2019-07-01 LAB — CBC WITH DIFFERENTIAL/PLATELET
Basophils Absolute: 0 10*3/uL (ref 0.0–0.2)
Basos: 1 %
EOS (ABSOLUTE): 0.1 10*3/uL (ref 0.0–0.4)
Eos: 3 %
Hematocrit: 39.8 % (ref 34.0–46.6)
Hemoglobin: 12.8 g/dL (ref 11.1–15.9)
Immature Grans (Abs): 0 10*3/uL (ref 0.0–0.1)
Immature Granulocytes: 0 %
Lymphocytes Absolute: 1.1 10*3/uL (ref 0.7–3.1)
Lymphs: 27 %
MCH: 32 pg (ref 26.6–33.0)
MCHC: 32.2 g/dL (ref 31.5–35.7)
MCV: 100 fL — ABNORMAL HIGH (ref 79–97)
Monocytes Absolute: 0.4 10*3/uL (ref 0.1–0.9)
Monocytes: 11 %
Neutrophils Absolute: 2.4 10*3/uL (ref 1.4–7.0)
Neutrophils: 58 %
Platelets: 271 10*3/uL (ref 150–450)
RBC: 4 x10E6/uL (ref 3.77–5.28)
RDW: 11.8 % (ref 11.7–15.4)
WBC: 4.2 10*3/uL (ref 3.4–10.8)

## 2019-07-01 LAB — BASIC METABOLIC PANEL
BUN/Creatinine Ratio: 13 (ref 12–28)
BUN: 9 mg/dL (ref 8–27)
CO2: 25 mmol/L (ref 20–29)
Calcium: 9.1 mg/dL (ref 8.7–10.3)
Chloride: 106 mmol/L (ref 96–106)
Creatinine, Ser: 0.69 mg/dL (ref 0.57–1.00)
GFR calc Af Amer: 108 mL/min/{1.73_m2} (ref >59)
GFR calc non Af Amer: 94 mL/min/{1.73_m2} (ref >59)
Glucose: 80 mg/dL (ref 65–99)
Potassium: 4.4 mmol/L (ref 3.5–5.2)
Sodium: 143 mmol/L (ref 134–144)

## 2019-07-01 LAB — HEPATIC FUNCTION PANEL
ALT: 23 IU/L (ref 0–32)
AST: 23 IU/L (ref 0–40)
Albumin: 4.1 g/dL (ref 3.8–4.8)
Alkaline Phosphatase: 57 IU/L (ref 39–117)
Bilirubin Total: 0.3 mg/dL (ref 0.0–1.2)
Bilirubin, Direct: 0.1 mg/dL (ref 0.00–0.40)
Total Protein: 6.6 g/dL (ref 6.0–8.5)

## 2019-07-01 LAB — LIPID PANEL
Chol/HDL Ratio: 3.8 ratio (ref 0.0–4.4)
Cholesterol, Total: 196 mg/dL (ref 100–199)
HDL: 51 mg/dL (ref >39)
LDL Chol Calc (NIH): 125 mg/dL — ABNORMAL HIGH (ref 0–99)
Triglycerides: 114 mg/dL (ref 0–149)
VLDL Cholesterol Cal: 20 mg/dL (ref 5–40)

## 2019-07-07 ENCOUNTER — Other Ambulatory Visit: Payer: Self-pay | Admitting: *Deleted

## 2019-07-07 MED ORDER — LEVOTHYROXINE SODIUM 75 MCG PO TABS: 75.0000 ug | ORAL_TABLET | Freq: Every day | ORAL | 3 refills | Status: DC

## 2019-07-12 DIAGNOSIS — Z23 Encounter for immunization: Secondary | ICD-10-CM | POA: Diagnosis not present

## 2019-09-01 DIAGNOSIS — D72829 Elevated white blood cell count, unspecified: Secondary | ICD-10-CM | POA: Diagnosis not present

## 2019-09-01 DIAGNOSIS — M0589 Other rheumatoid arthritis with rheumatoid factor of multiple sites: Secondary | ICD-10-CM | POA: Diagnosis not present

## 2019-09-01 DIAGNOSIS — M654 Radial styloid tenosynovitis [de Quervain]: Secondary | ICD-10-CM | POA: Diagnosis not present

## 2019-09-01 DIAGNOSIS — M79645 Pain in left finger(s): Secondary | ICD-10-CM | POA: Diagnosis not present

## 2019-12-30 DIAGNOSIS — M654 Radial styloid tenosynovitis [de Quervain]: Secondary | ICD-10-CM | POA: Diagnosis not present

## 2019-12-30 DIAGNOSIS — M79645 Pain in left finger(s): Secondary | ICD-10-CM | POA: Diagnosis not present

## 2019-12-30 DIAGNOSIS — M0589 Other rheumatoid arthritis with rheumatoid factor of multiple sites: Secondary | ICD-10-CM | POA: Diagnosis not present

## 2019-12-30 DIAGNOSIS — D72829 Elevated white blood cell count, unspecified: Secondary | ICD-10-CM | POA: Diagnosis not present

## 2020-01-01 ENCOUNTER — Other Ambulatory Visit: Payer: Self-pay | Admitting: *Deleted

## 2020-01-01 DIAGNOSIS — E039 Hypothyroidism, unspecified: Secondary | ICD-10-CM

## 2020-01-01 DIAGNOSIS — E875 Hyperkalemia: Secondary | ICD-10-CM

## 2020-01-01 DIAGNOSIS — E785 Hyperlipidemia, unspecified: Secondary | ICD-10-CM

## 2020-01-01 MED ORDER — PRAVASTATIN SODIUM 10 MG PO TABS: 10.0000 mg | ORAL_TABLET | Freq: Every day | ORAL | 0 refills | Status: DC

## 2020-01-01 MED ORDER — ESCITALOPRAM OXALATE 10 MG PO TABS: 10.0000 mg | ORAL_TABLET | Freq: Every day | ORAL | 0 refills | Status: DC

## 2020-01-01 NOTE — Telephone Encounter (Signed)
Does patient need to have any labs complete? Please advise. Thank you

## 2020-01-01 NOTE — Telephone Encounter (Signed)
Yes, pls order cbc, cmp, lipids, and tsh. Thx. Dr. Kelby Adell  

## 2020-01-01 NOTE — Telephone Encounter (Signed)
Last labs completed on 06/30/19 CBC, BMET, HEPATIC, LIPID and TSH. Please advise. Thank you

## 2020-01-01 NOTE — Telephone Encounter (Signed)
Pt has CPE scheduled 10/12 and would like to know if lab work is needed.

## 2020-01-02 NOTE — Telephone Encounter (Signed)
Lab orders placed and mailed to patient  

## 2020-01-02 NOTE — Addendum Note (Signed)
Addended by: Vicente Males on: 01/02/2020 08:28 AM   Modules accepted: Orders

## 2020-01-12 DIAGNOSIS — Z961 Presence of intraocular lens: Secondary | ICD-10-CM | POA: Diagnosis not present

## 2020-01-12 DIAGNOSIS — H35362 Drusen (degenerative) of macula, left eye: Secondary | ICD-10-CM | POA: Diagnosis not present

## 2020-01-22 DIAGNOSIS — Z23 Encounter for immunization: Secondary | ICD-10-CM | POA: Diagnosis not present

## 2020-01-23 DIAGNOSIS — E785 Hyperlipidemia, unspecified: Secondary | ICD-10-CM | POA: Diagnosis not present

## 2020-01-23 DIAGNOSIS — E039 Hypothyroidism, unspecified: Secondary | ICD-10-CM | POA: Diagnosis not present

## 2020-01-23 DIAGNOSIS — E875 Hyperkalemia: Secondary | ICD-10-CM | POA: Diagnosis not present

## 2020-01-24 LAB — CBC WITH DIFFERENTIAL/PLATELET
Basophils Absolute: 0 10*3/uL (ref 0.0–0.2)
Basos: 1 %
EOS (ABSOLUTE): 0.2 10*3/uL (ref 0.0–0.4)
Eos: 3 %
Hematocrit: 37.8 % (ref 34.0–46.6)
Hemoglobin: 12.1 g/dL (ref 11.1–15.9)
Immature Grans (Abs): 0 10*3/uL (ref 0.0–0.1)
Immature Granulocytes: 0 %
Lymphocytes Absolute: 1.1 10*3/uL (ref 0.7–3.1)
Lymphs: 19 %
MCH: 30.6 pg (ref 26.6–33.0)
MCHC: 32 g/dL (ref 31.5–35.7)
MCV: 96 fL (ref 79–97)
Monocytes Absolute: 0.7 10*3/uL (ref 0.1–0.9)
Monocytes: 11 %
Neutrophils Absolute: 3.9 10*3/uL (ref 1.4–7.0)
Neutrophils: 66 %
Platelets: 280 10*3/uL (ref 150–450)
RBC: 3.95 x10E6/uL (ref 3.77–5.28)
RDW: 12.4 % (ref 11.7–15.4)
WBC: 5.9 10*3/uL (ref 3.4–10.8)

## 2020-01-24 LAB — COMPREHENSIVE METABOLIC PANEL
ALT: 21 IU/L (ref 0–32)
AST: 24 IU/L (ref 0–40)
Albumin/Globulin Ratio: 1.8 (ref 1.2–2.2)
Albumin: 4.1 g/dL (ref 3.8–4.8)
Alkaline Phosphatase: 83 IU/L (ref 44–121)
BUN/Creatinine Ratio: 19 (ref 12–28)
BUN: 14 mg/dL (ref 8–27)
Bilirubin Total: 0.5 mg/dL (ref 0.0–1.2)
CO2: 23 mmol/L (ref 20–29)
Calcium: 9.1 mg/dL (ref 8.7–10.3)
Chloride: 102 mmol/L (ref 96–106)
Creatinine, Ser: 0.72 mg/dL (ref 0.57–1.00)
GFR calc Af Amer: 103 mL/min/{1.73_m2} (ref >59)
GFR calc non Af Amer: 89 mL/min/{1.73_m2} (ref >59)
Globulin, Total: 2.3 g/dL (ref 1.5–4.5)
Glucose: 79 mg/dL (ref 65–99)
Potassium: 4.3 mmol/L (ref 3.5–5.2)
Sodium: 142 mmol/L (ref 134–144)
Total Protein: 6.4 g/dL (ref 6.0–8.5)

## 2020-01-24 LAB — LIPID PANEL
Chol/HDL Ratio: 4.6 ratio — ABNORMAL HIGH (ref 0.0–4.4)
Cholesterol, Total: 207 mg/dL — ABNORMAL HIGH (ref 100–199)
HDL: 45 mg/dL (ref >39)
LDL Chol Calc (NIH): 144 mg/dL — ABNORMAL HIGH (ref 0–99)
Triglycerides: 98 mg/dL (ref 0–149)
VLDL Cholesterol Cal: 18 mg/dL (ref 5–40)

## 2020-01-24 LAB — TSH: TSH: 2.58 u[IU]/mL (ref 0.450–4.500)

## 2020-01-26 ENCOUNTER — Other Ambulatory Visit (HOSPITAL_COMMUNITY): Payer: Self-pay | Admitting: Family Medicine

## 2020-01-26 DIAGNOSIS — Z1231 Encounter for screening mammogram for malignant neoplasm of breast: Secondary | ICD-10-CM

## 2020-01-28 ENCOUNTER — Other Ambulatory Visit: Payer: Self-pay

## 2020-01-28 ENCOUNTER — Ambulatory Visit (HOSPITAL_COMMUNITY)
Admission: RE | Admit: 2020-01-28 | Discharge: 2020-01-28 | Disposition: A | Discharge status: Home / Self Care | Payer: BC Managed Care – PPO | Source: Ambulatory Visit | Attending: Family Medicine | Admitting: Family Medicine

## 2020-01-28 DIAGNOSIS — Z1231 Encounter for screening mammogram for malignant neoplasm of breast: Secondary | ICD-10-CM

## 2020-02-03 ENCOUNTER — Ambulatory Visit (INDEPENDENT_AMBULATORY_CARE_PROVIDER_SITE_OTHER): Payer: BC Managed Care – PPO | Admitting: Family Medicine

## 2020-02-03 ENCOUNTER — Encounter: Payer: Self-pay | Admitting: Family Medicine

## 2020-02-03 ENCOUNTER — Encounter: Payer: BC Managed Care – PPO | Admitting: Family Medicine

## 2020-02-03 VITALS — BP 108/78 | HR 94 | Temp 98.0°F | Wt 168.0 lb

## 2020-02-03 DIAGNOSIS — R109 Unspecified abdominal pain: Secondary | ICD-10-CM | POA: Insufficient documentation

## 2020-02-03 DIAGNOSIS — Z124 Encounter for screening for malignant neoplasm of cervix: Secondary | ICD-10-CM

## 2020-02-03 DIAGNOSIS — Z0000 Encounter for general adult medical examination without abnormal findings: Secondary | ICD-10-CM

## 2020-02-03 MED ORDER — HYOSCYAMINE SULFATE SL 0.125 MG SL SUBL: 0.1250 mg | SUBLINGUAL_TABLET | SUBLINGUAL | 1 refills | Status: DC

## 2020-02-03 MED ORDER — LEVOTHYROXINE SODIUM 75 MCG PO TABS: 75.0000 ug | ORAL_TABLET | Freq: Every day | ORAL | 3 refills | Status: DC
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
## 2020-02-03 NOTE — Patient Instructions (Signed)

## 2020-02-05 ENCOUNTER — Other Ambulatory Visit: Payer: Self-pay | Admitting: Family Medicine

## 2020-02-05 MED ORDER — LEVOTHYROXINE SODIUM 75 MCG PO TABS: 75.0000 ug | ORAL_TABLET | Freq: Every day | ORAL | 3 refills | Status: DC

## 2020-02-08 LAB — IGP, APTIMA HPV: HPV Aptima: NEGATIVE

## 2020-02-11 ENCOUNTER — Telehealth: Payer: Self-pay | Admitting: Family Medicine

## 2020-02-11 MED ORDER — CEPHALEXIN 500 MG PO CAPS: ORAL_CAPSULE | ORAL | 0 refills | Status: DC

## 2020-02-11 NOTE — Addendum Note (Signed)
Addended by: Vicente Males on: 02/11/2020 03:10 PM   Modules accepted: Orders

## 2020-02-11 NOTE — Telephone Encounter (Signed)
Medication sent in and pt is aware  

## 2020-02-11 NOTE — Telephone Encounter (Signed)
Keflex 500mg  bid for 7 days.  #14 no refills. Thx. Dr. Lovena Le

## 2020-02-11 NOTE — Telephone Encounter (Signed)
Pt began having frequency, painful urination and some blood in urine this morning. Pt is currently on vacation at [facility]. [facility]  Please advise. Thank you  [facility] [place] [place]

## 2020-03-08 ENCOUNTER — Other Ambulatory Visit: Payer: Self-pay | Admitting: Family Medicine

## 2020-03-10 ENCOUNTER — Telehealth: Payer: Self-pay | Admitting: Family Medicine

## 2020-03-10 NOTE — Telephone Encounter (Signed)
Left name, DOB, MRN, symptom onset date and phone number on infusion hotline.

## 2020-03-10 NOTE — Telephone Encounter (Signed)
Pls call covid infusion line and leave message, about pt needing info on where to go to get her monoclonal ab infusion. Thx. Dr. Lovena Le

## 2020-03-10 NOTE — Telephone Encounter (Signed)
Pt contacted office and states they someone contacted her about the monoclonial infusion but is needing a referral to [facility]. Pt is on Day 7 of symptoms. Pt tested Sunday at [place]. Please advise. Thank you

## 2020-03-11 ENCOUNTER — Telehealth: Payer: Self-pay | Admitting: Nurse Practitioner

## 2020-03-11 DIAGNOSIS — U071 COVID-19: Secondary | ICD-10-CM

## 2020-03-11 NOTE — Telephone Encounter (Signed)
Called to Discuss with patient about Covid symptoms and the use of a monoclonal antibody infusion for those with mild to moderate Covid symptoms and at a high risk of hospitalization.     Pt is qualified for this infusion at the [place] [facility] due to co-morbid conditions and/or a member of an at-risk group.     Patient concerned of cost. I encouraged her to contact her insurance company for definitive estimate. I provided her with CPT code for specific mAB. She will call back should she wish to schedule treatment. Last date eligible for infusion would be 11/20  Beckey Rutter, [place], AGNP-C [phone] ([place])

## 2020-04-05 DIAGNOSIS — M0589 Other rheumatoid arthritis with rheumatoid factor of multiple sites: Secondary | ICD-10-CM | POA: Diagnosis not present

## 2020-06-28 DIAGNOSIS — M79645 Pain in left finger(s): Secondary | ICD-10-CM | POA: Diagnosis not present

## 2020-06-28 DIAGNOSIS — M0589 Other rheumatoid arthritis with rheumatoid factor of multiple sites: Secondary | ICD-10-CM | POA: Diagnosis not present

## 2020-06-28 DIAGNOSIS — D72829 Elevated white blood cell count, unspecified: Secondary | ICD-10-CM | POA: Diagnosis not present

## 2020-06-28 DIAGNOSIS — M654 Radial styloid tenosynovitis [de Quervain]: Secondary | ICD-10-CM | POA: Diagnosis not present

## 2020-09-06 ENCOUNTER — Other Ambulatory Visit: Payer: Self-pay | Admitting: Family Medicine

## 2020-09-30 DIAGNOSIS — M0589 Other rheumatoid arthritis with rheumatoid factor of multiple sites: Secondary | ICD-10-CM | POA: Diagnosis not present

## 2020-10-01 NOTE — Telephone Encounter (Signed)
LM for patient to call for an appt.

## 2020-10-06 NOTE — Telephone Encounter (Signed)
Scheduled appointment for 6/29

## 2020-10-07 NOTE — Telephone Encounter (Signed)
Pt needs to establish care with me.  Needs appt in next 30 days.   Thx.   Dr. Lovena Le

## 2020-10-20 ENCOUNTER — Encounter: Payer: Self-pay | Admitting: Family Medicine

## 2020-10-20 ENCOUNTER — Other Ambulatory Visit: Payer: Self-pay

## 2020-10-20 ENCOUNTER — Ambulatory Visit: Payer: BC Managed Care – PPO | Admitting: Family Medicine

## 2020-10-20 VITALS — BP 108/70 | HR 65 | Temp 97.9°F | Ht 67.0 in | Wt 154.0 lb

## 2020-10-20 DIAGNOSIS — H65192 Other acute nonsuppurative otitis media, left ear: Secondary | ICD-10-CM

## 2020-10-20 DIAGNOSIS — E782 Mixed hyperlipidemia: Secondary | ICD-10-CM

## 2020-10-20 DIAGNOSIS — F32A Depression, unspecified: Secondary | ICD-10-CM

## 2020-10-20 DIAGNOSIS — E039 Hypothyroidism, unspecified: Secondary | ICD-10-CM

## 2020-10-20 MED ORDER — FLUTICASONE PROPIONATE 50 MCG/ACT NA SUSP: 2.0000 | Freq: Every day | NASAL | 1 refills | Status: DC

## 2020-10-20 MED ORDER — ESCITALOPRAM OXALATE 10 MG PO TABS: 10.0000 mg | ORAL_TABLET | Freq: Every day | ORAL | 1 refills | Status: DC

## 2020-10-20 MED ORDER — PRAVASTATIN SODIUM 10 MG PO TABS: 10.0000 mg | ORAL_TABLET | Freq: Every day | ORAL | 1 refills | Status: DC

## 2020-10-20 NOTE — Progress Notes (Signed)
Patient ID: Kristy Moon, female    DOB: Jun 09, 1955, 65 y.o.   MRN: [id number]   Chief Complaint  Patient presents with   Hyperlipidemia   Subjective:    HPI Med check up- HLD and anxiety/depression.  Pt states she sweats a lot.  Sweating any time, and went through start of menopause at 11.  Feeling more.   Has been on lexapro for so long, but wanting to try to get off it.  Feeling badly when going off the med in past.  Pt wanting to think about it.  Taking a lot of ibuprofen. Pt dec her ibuprofen then doesn't need levsin. Has diarrhea every morning. Not needing to take it now.   Seeing RA- seeing rheumatology specialist and taking arava.   Hypothyroidism- Not having any side effects from thyroid meds.  Compliant with meds. No diarrhea, constipation, depression/anxiety, palpitations, excessive hair loss or weight gain/loss. No heat/cold intolerance.  HLD- doing well no new concerns.  Compliant with meds. No chest pain, palpitations, myalgias or joint pains.   Medical History Oneal has a past medical history of Depression, GERD (gastroesophageal reflux disease), Hyperlipidemia, Hypothyroidism, IBS (irritable bowel syndrome), and Vocal cord nodule.   Outpatient Encounter Medications as of 10/20/2020  Medication Sig   fluticasone (FLONASE) 50 MCG/ACT nasal spray Place 2 sprays into both nostrils daily.   acyclovir cream (ZOVIRAX) 5 % Apply 1 application topically every 4 (four) hours. Prn outbreak   celecoxib (CELEBREX) 200 MG capsule Take 200 mg by mouth daily.    Cholecalciferol (VITAMIN D3) 2000 units capsule Take 2,000 Units by mouth daily.   escitalopram (LEXAPRO) 10 MG tablet Take 1 tablet (10 mg total) by mouth daily.   Hyoscyamine Sulfate SL (LEVSIN/SL) 0.125 MG SUBL Place 0.125 mg under the tongue every 4 (four) hours. Max 6 doses per day.   ibuprofen (ADVIL,MOTRIN) 200 MG tablet Take 600 mg by mouth every 4 (four) hours as needed for headache or moderate  pain.   Krill Oil 500 MG CAPS Take 500 mg by mouth daily.   leflunomide (ARAVA) 20 MG tablet Take 20 mg by mouth daily.   levothyroxine (SYNTHROID) 75 MCG tablet Take 1 tablet (75 mcg total) by mouth daily before breakfast.   Melatonin 5 MG TABS Take 5 mg by mouth at bedtime.   pravastatin (PRAVACHOL) 10 MG tablet Take 1 tablet (10 mg total) by mouth daily. for cholesterol   [DISCONTINUED] cephALEXin (KEFLEX) 500 MG capsule Take one capsule po BID for 7 days   [DISCONTINUED] escitalopram (LEXAPRO) 10 MG tablet TAKE 1 TABLET DAILY   [DISCONTINUED] pravastatin (PRAVACHOL) 10 MG tablet TAKE 1 TABLET DAILY FOR CHOLESTEROL   No facility-administered encounter medications on file as of 10/20/2020.     Review of Systems  Constitutional:  Positive for diaphoresis. Negative for chills and fever.  HENT:  Negative for congestion, rhinorrhea and sore throat.   Respiratory:  Negative for cough, shortness of breath and wheezing.   Cardiovascular:  Negative for chest pain and leg swelling.  Gastrointestinal:  Negative for abdominal pain, diarrhea, nausea and vomiting.  Genitourinary:  Negative for dysuria and frequency.  Musculoskeletal:  Negative for arthralgias and back pain.  Skin:  Negative for rash.  Neurological:  Negative for dizziness, weakness and headaches.    Vitals BP 108/70   Pulse 65   Temp 97.9 F (36.6 C)   Ht '5\' 7"'  (1.702 m)   Wt 154 lb (69.9 kg)   SpO2 98%  BMI 24.12 kg/m   Objective:   Physical Exam Vitals and nursing note reviewed.  Constitutional:      General: She is not in acute distress.    Appearance: Normal appearance. She is not ill-appearing.  HENT:     Head: Normocephalic and atraumatic.     Right Ear: Tympanic membrane, ear canal and external ear normal.     Left Ear: Ear canal and external ear normal.     Ears:     Comments: +serous effusion- left    Nose: Nose normal.     Mouth/Throat:     Mouth: Mucous membranes are moist.     Pharynx: Oropharynx  is clear.  Eyes:     Extraocular Movements: Extraocular movements intact.     Conjunctiva/sclera: Conjunctivae normal.     Pupils: Pupils are equal, round, and reactive to light.  Cardiovascular:     Rate and Rhythm: Normal rate and regular rhythm.     Pulses: Normal pulses.     Heart sounds: Normal heart sounds.  Pulmonary:     Effort: Pulmonary effort is normal.     Breath sounds: Normal breath sounds. No wheezing, rhonchi or rales.  Musculoskeletal:        General: Normal range of motion.     Right lower leg: No edema.     Left lower leg: No edema.  Skin:    General: Skin is warm and dry.     Findings: No lesion or rash.  Neurological:     General: No focal deficit present.     Mental Status: She is alert and oriented to person, place, and time.     Cranial Nerves: No cranial nerve deficit.  Psychiatric:        Mood and Affect: Mood normal.        Behavior: Behavior normal.     Assessment and Plan   1. Hypothyroidism, unspecified type - TSH  2. Mixed hyperlipidemia - CBC - CMP14+EGFR - Lipid panel  3. Chronic depression  4. Acute effusion of left ear   Pt to get labs in the next week.  Serous effusion- flonase, allergy med otc. For 7-10 days.  Call if not improving.  Depression- stable.  Increased sweating may be related to the SSRI.  Pt will cont to monitor and pt doing well on lexapro. Will cont this for now.   Hypothyroid- stable. Cont meds. Recheck labs.  Hld- stable. Recheck labs. Cont meds.  Return in about 6 months (around 04/21/2021) for f/u hypothyroid, hld.  10/20/2020  Addendum- increasing synthroid from 36mg to 867m.

## 2020-10-21 DIAGNOSIS — E039 Hypothyroidism, unspecified: Secondary | ICD-10-CM | POA: Diagnosis not present

## 2020-10-21 DIAGNOSIS — E782 Mixed hyperlipidemia: Secondary | ICD-10-CM | POA: Diagnosis not present

## 2020-10-22 LAB — CMP14+EGFR
ALT: 22 IU/L (ref 0–32)
AST: 21 IU/L (ref 0–40)
Albumin/Globulin Ratio: 1.8 (ref 1.2–2.2)
Albumin: 4.4 g/dL (ref 3.8–4.8)
Alkaline Phosphatase: 101 IU/L (ref 44–121)
BUN/Creatinine Ratio: 20 (ref 12–28)
BUN: 15 mg/dL (ref 8–27)
Bilirubin Total: 0.5 mg/dL (ref 0.0–1.2)
CO2: 23 mmol/L (ref 20–29)
Calcium: 9.7 mg/dL (ref 8.7–10.3)
Chloride: 101 mmol/L (ref 96–106)
Creatinine, Ser: 0.76 mg/dL (ref 0.57–1.00)
Globulin, Total: 2.5 g/dL (ref 1.5–4.5)
Glucose: 92 mg/dL (ref 65–99)
Potassium: 5 mmol/L (ref 3.5–5.2)
Sodium: 139 mmol/L (ref 134–144)
Total Protein: 6.9 g/dL (ref 6.0–8.5)
eGFR: 87 mL/min/{1.73_m2} (ref >59)

## 2020-10-22 LAB — CBC
Hematocrit: 41.5 % (ref 34.0–46.6)
Hemoglobin: 13.2 g/dL (ref 11.1–15.9)
MCH: 31.4 pg (ref 26.6–33.0)
MCHC: 31.8 g/dL (ref 31.5–35.7)
MCV: 99 fL — ABNORMAL HIGH (ref 79–97)
Platelets: 311 10*3/uL (ref 150–450)
RBC: 4.21 x10E6/uL (ref 3.77–5.28)
RDW: 11.9 % (ref 11.7–15.4)
WBC: 6.2 10*3/uL (ref 3.4–10.8)

## 2020-10-22 LAB — LIPID PANEL
Chol/HDL Ratio: 5.3 ratio — ABNORMAL HIGH (ref 0.0–4.4)
Cholesterol, Total: 237 mg/dL — ABNORMAL HIGH (ref 100–199)
HDL: 45 mg/dL (ref >39)
LDL Chol Calc (NIH): 161 mg/dL — ABNORMAL HIGH (ref 0–99)
Triglycerides: 167 mg/dL — ABNORMAL HIGH (ref 0–149)
VLDL Cholesterol Cal: 31 mg/dL (ref 5–40)

## 2020-10-22 LAB — TSH: TSH: 5.96 u[IU]/mL — ABNORMAL HIGH (ref 0.450–4.500)

## 2020-10-24 IMAGING — MG DIGITAL SCREENING BILAT W/ TOMO W/ CAD
8 series · 9 of 24 positions shown · non-contrast
Comparison: Previous exam(s).

CLINICAL DATA: Screening.

EXAM:
DIGITAL SCREENING BILATERAL MAMMOGRAM WITH TOMO AND CAD

[R MLO synth-2D]
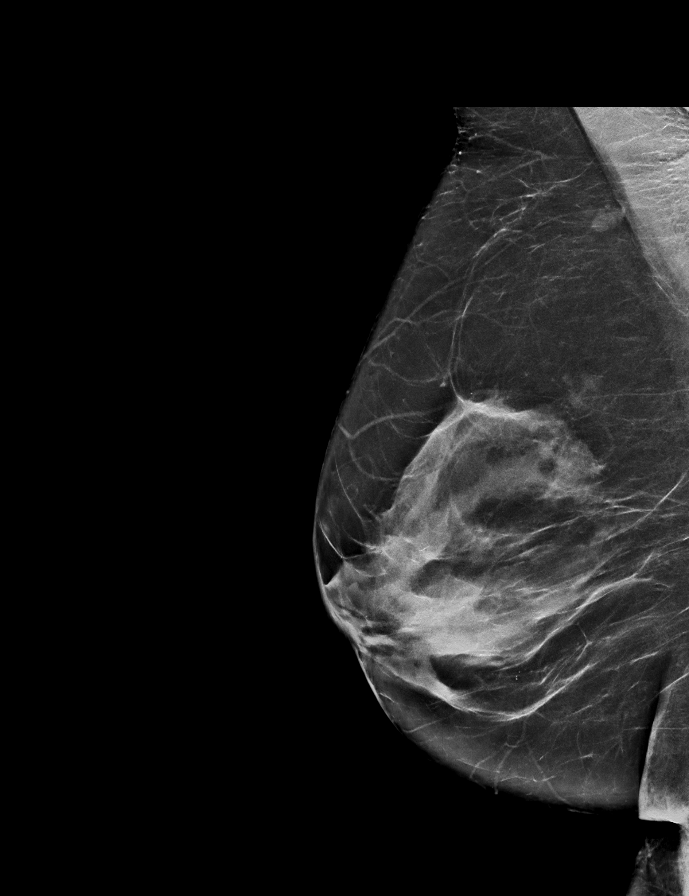

[L MLO synth-2D]
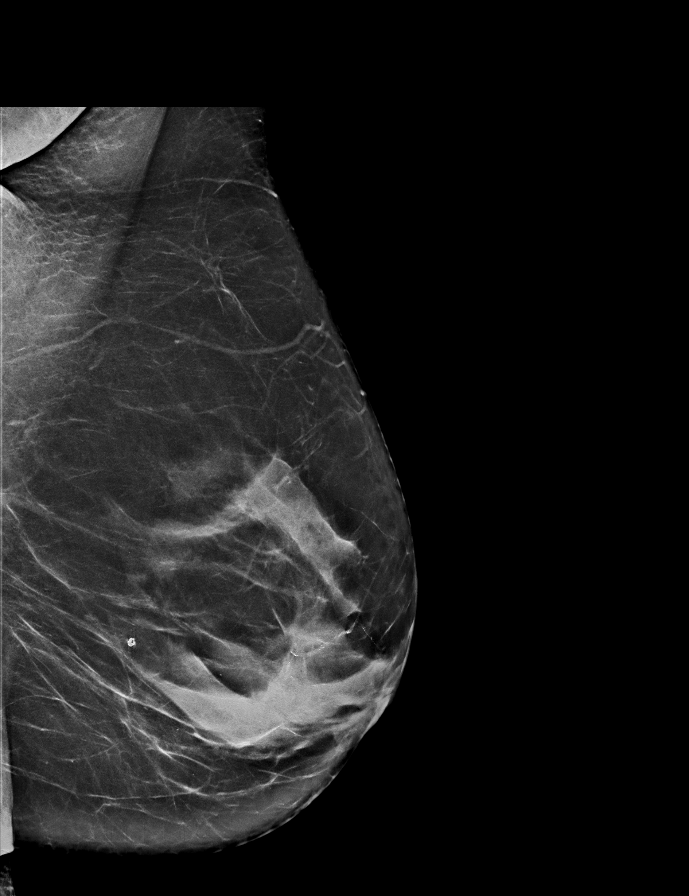

[R CC synth-2D]
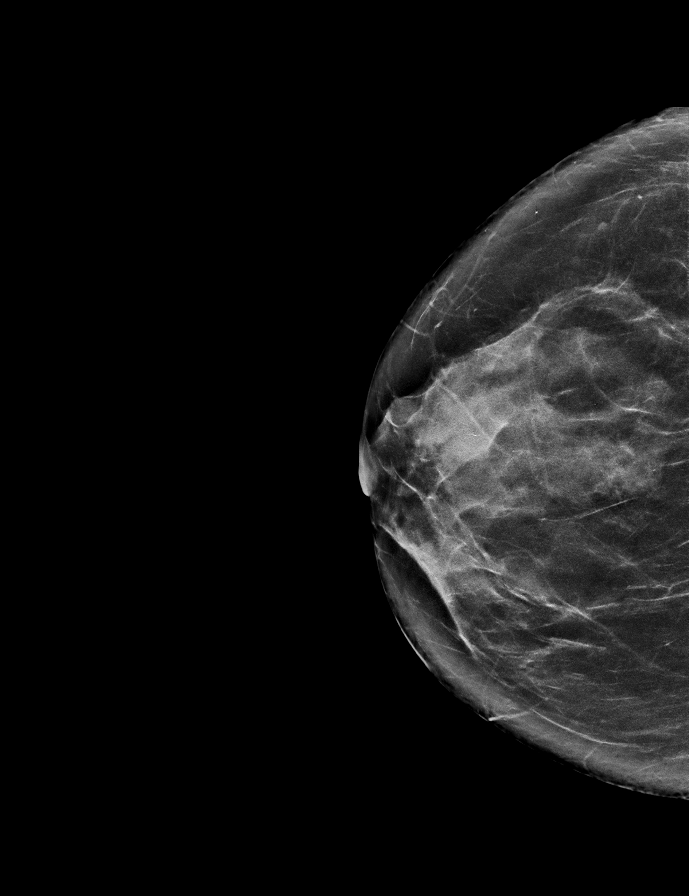

[L CC synth-2D]
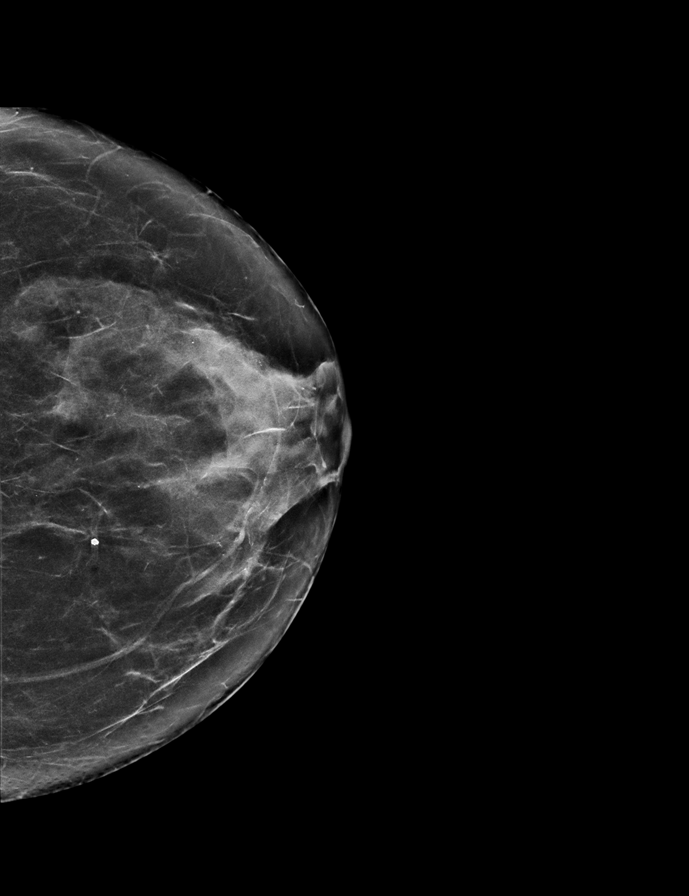

[L CC tomo · 2 of 62 frames shown]
[frame 21/62]
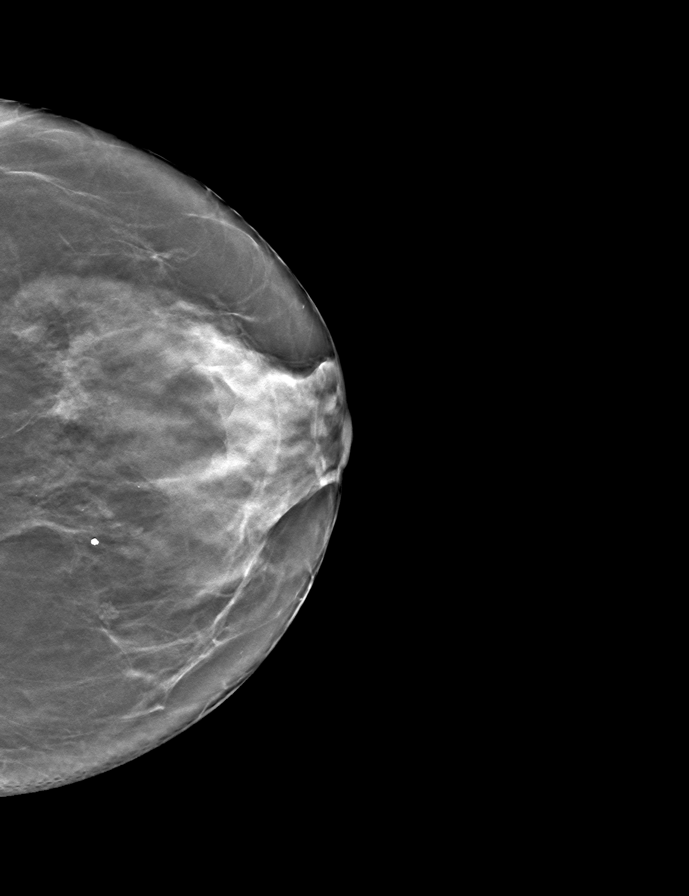
[frame 31/62]
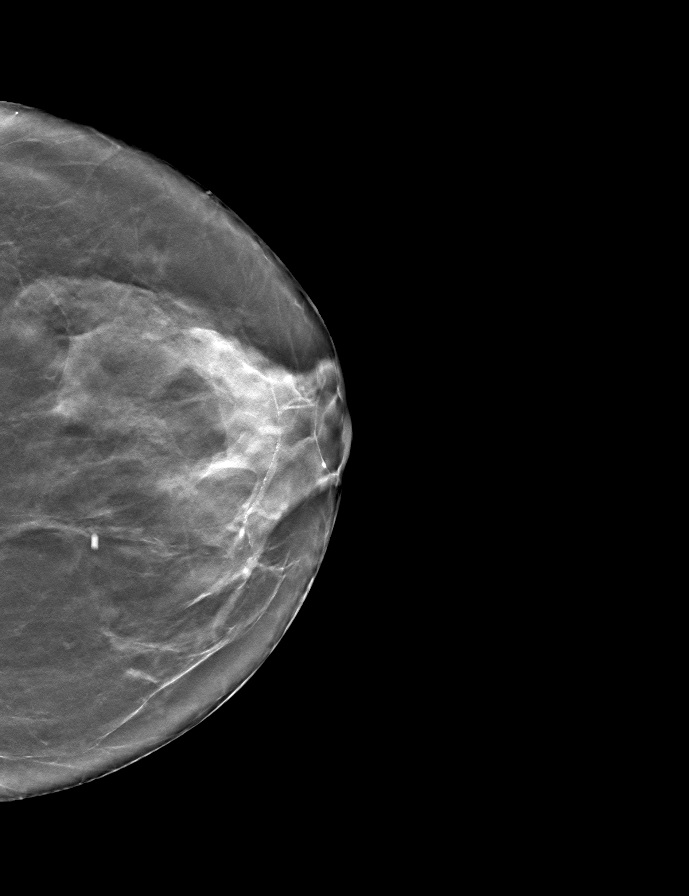

[L MLO tomo · tomo slice 36/71.0]
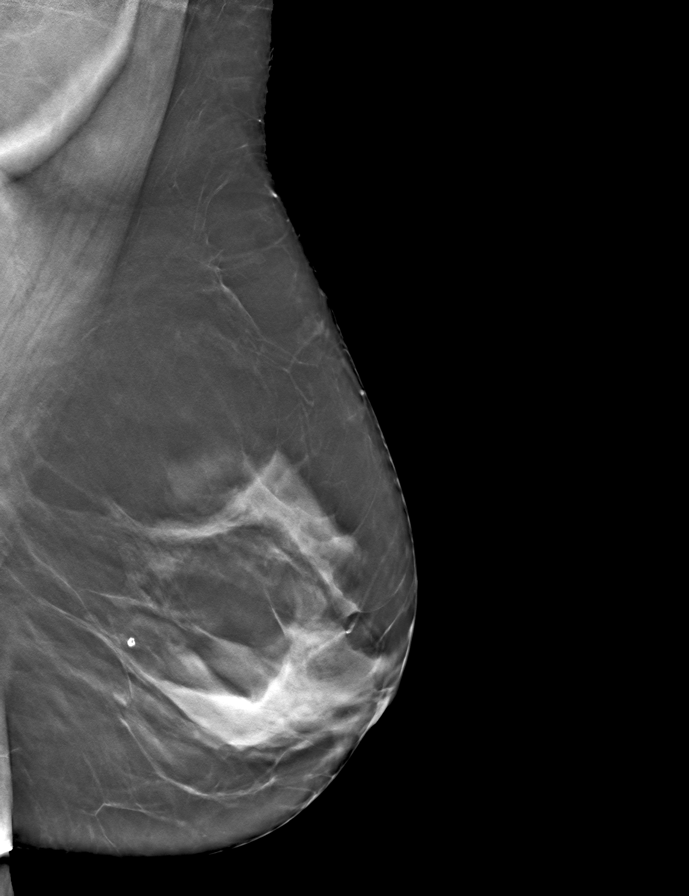

[R MLO tomo · tomo slice 35/70.0]
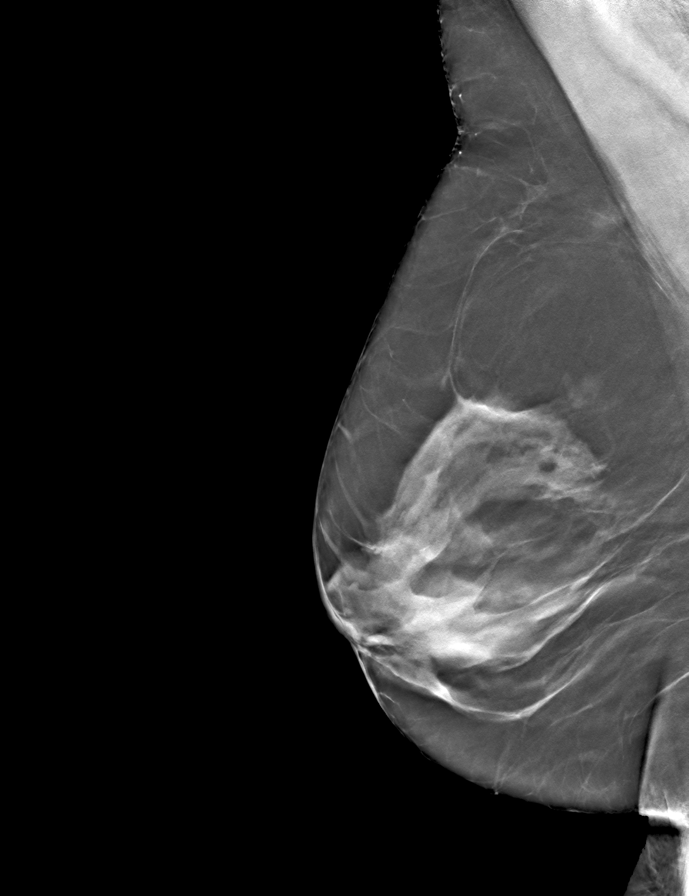

[R CC tomo · tomo slice 32/63.0]
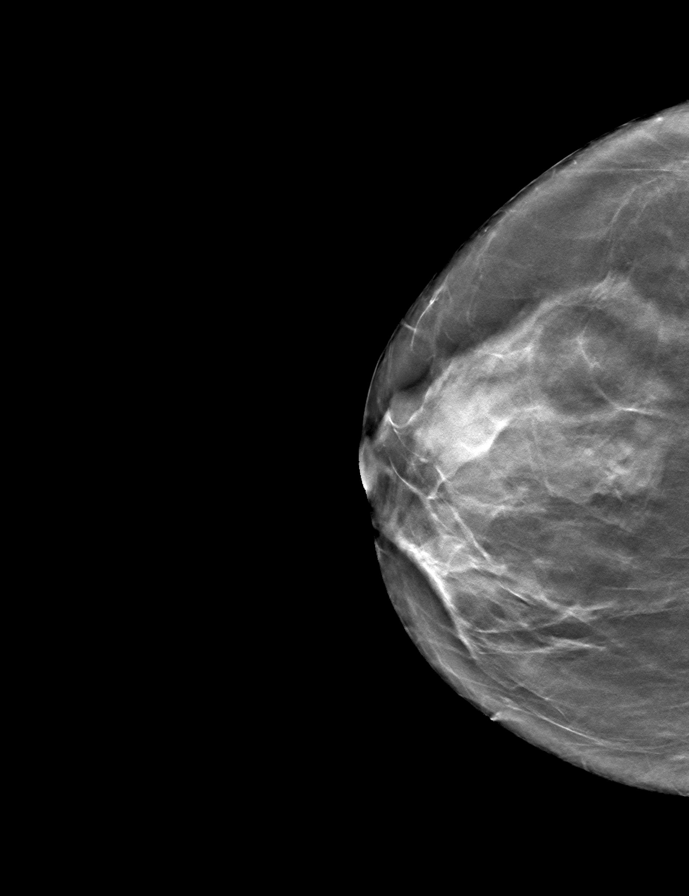

[9 of 24 positions shown; findings below may reference images not displayed]

ACR Breast Density Category c: The breast tissue is heterogeneously
dense, which may obscure small masses.
FINDINGS: There are no findings suspicious for malignancy. Images were
processed with CAD.
IMPRESSION: No mammographic evidence of malignancy. A result letter of this
screening mammogram will be mailed directly to the patient.

RECOMMENDATION:
Screening mammogram in one year. (Code:[id number])

BI-RADS CATEGORY  1: Negative.

## 2020-10-26 MED ORDER — LEVOTHYROXINE SODIUM 88 MCG PO TABS: 88.0000 ug | ORAL_TABLET | Freq: Every day | ORAL | 1 refills | Status: DC

## 2020-10-27 ENCOUNTER — Other Ambulatory Visit: Payer: Self-pay | Admitting: *Deleted

## 2020-10-27 DIAGNOSIS — E039 Hypothyroidism, unspecified: Secondary | ICD-10-CM

## 2020-12-17 DIAGNOSIS — E039 Hypothyroidism, unspecified: Secondary | ICD-10-CM | POA: Diagnosis not present

## 2020-12-18 LAB — TSH: TSH: 0.74 u[IU]/mL (ref 0.450–4.500)

## 2020-12-19 ENCOUNTER — Other Ambulatory Visit: Payer: Self-pay | Admitting: Family Medicine

## 2020-12-19 MED ORDER — LEVOTHYROXINE SODIUM 88 MCG PO TABS: ORAL_TABLET | ORAL | 1 refills | Status: DC

## 2020-12-28 DIAGNOSIS — M0589 Other rheumatoid arthritis with rheumatoid factor of multiple sites: Secondary | ICD-10-CM | POA: Diagnosis not present

## 2020-12-28 DIAGNOSIS — M79645 Pain in left finger(s): Secondary | ICD-10-CM | POA: Diagnosis not present

## 2020-12-28 DIAGNOSIS — D72829 Elevated white blood cell count, unspecified: Secondary | ICD-10-CM | POA: Diagnosis not present

## 2021-01-12 DIAGNOSIS — H35362 Drusen (degenerative) of macula, left eye: Secondary | ICD-10-CM | POA: Diagnosis not present

## 2021-01-12 DIAGNOSIS — Z961 Presence of intraocular lens: Secondary | ICD-10-CM | POA: Diagnosis not present

## 2021-02-14 ENCOUNTER — Telehealth: Payer: Self-pay | Admitting: Family Medicine

## 2021-02-14 MED ORDER — FLUTICASONE PROPIONATE 50 MCG/ACT NA SUSP: 2.0000 | Freq: Every day | NASAL | 1 refills | Status: DC

## 2021-02-14 NOTE — Telephone Encounter (Signed)
Refill sent to pharmacy.   

## 2021-02-14 NOTE — Addendum Note (Signed)
Addended by: Vicente Males on: 02/14/2021 02:42 PM   Modules accepted: Orders

## 2021-02-14 NOTE — Telephone Encounter (Signed)
[facility] requesting refill on Fluticasone Nasal Spray. Requesting 90 day supply with 3 refills. Place 2 sprays into both nostrils daily. Pt last seen 10/20/20 for hypothyroidism. Please advise. Thank you

## 2021-02-21 ENCOUNTER — Other Ambulatory Visit: Payer: Self-pay | Admitting: Family Medicine

## 2021-02-24 DIAGNOSIS — D234 Other benign neoplasm of skin of scalp and neck: Secondary | ICD-10-CM | POA: Diagnosis not present

## 2021-02-24 DIAGNOSIS — D210 Benign neoplasm of connective and other soft tissue of head, face and neck: Secondary | ICD-10-CM | POA: Diagnosis not present

## 2021-03-04 DIAGNOSIS — R091 Pleurisy: Secondary | ICD-10-CM | POA: Diagnosis not present

## 2021-03-04 DIAGNOSIS — J069 Acute upper respiratory infection, unspecified: Secondary | ICD-10-CM | POA: Diagnosis not present

## 2021-03-04 DIAGNOSIS — J209 Acute bronchitis, unspecified: Secondary | ICD-10-CM | POA: Diagnosis not present

## 2021-03-27 ENCOUNTER — Other Ambulatory Visit: Payer: Self-pay | Admitting: Family Medicine

## 2021-03-30 DIAGNOSIS — M0589 Other rheumatoid arthritis with rheumatoid factor of multiple sites: Secondary | ICD-10-CM | POA: Diagnosis not present

## 2021-04-06 ENCOUNTER — Other Ambulatory Visit: Payer: Self-pay | Admitting: Family Medicine

## 2021-04-14 DIAGNOSIS — J342 Deviated nasal septum: Secondary | ICD-10-CM | POA: Diagnosis not present

## 2021-04-14 DIAGNOSIS — J310 Chronic rhinitis: Secondary | ICD-10-CM | POA: Diagnosis not present

## 2021-04-14 DIAGNOSIS — J343 Hypertrophy of nasal turbinates: Secondary | ICD-10-CM | POA: Diagnosis not present

## 2021-04-18 ENCOUNTER — Telehealth: Payer: Self-pay | Admitting: Family Medicine

## 2021-05-02 NOTE — Telephone Encounter (Signed)
Pt called and stated [facility] stated they could not fill the Synthroid for her thyroid. Pt would like a call. [phone].

## 2021-05-19 ENCOUNTER — Other Ambulatory Visit: Payer: Self-pay | Admitting: *Deleted

## 2021-05-19 MED ORDER — LEVOTHYROXINE SODIUM 88 MCG PO TABS: ORAL_TABLET | ORAL | 0 refills | Status: DC

## 2021-05-19 NOTE — Telephone Encounter (Signed)
Has appointment 06/15/21

## 2021-05-19 NOTE — Telephone Encounter (Signed)
Has appointment on 06/15/21

## 2021-05-19 NOTE — Telephone Encounter (Signed)
[facility] not [facility] her rx for the Synthroid.  [facility] sent [facility] per pt's request for a month supply.

## 2021-05-24 DIAGNOSIS — J3489 Other specified disorders of nose and nasal sinuses: Secondary | ICD-10-CM | POA: Diagnosis not present

## 2021-05-24 DIAGNOSIS — J342 Deviated nasal septum: Secondary | ICD-10-CM | POA: Diagnosis not present

## 2021-05-24 DIAGNOSIS — J343 Hypertrophy of nasal turbinates: Secondary | ICD-10-CM | POA: Diagnosis not present

## 2021-06-06 ENCOUNTER — Other Ambulatory Visit: Payer: Self-pay | Admitting: Family Medicine

## 2021-06-15 ENCOUNTER — Ambulatory Visit: Payer: BC Managed Care – PPO | Admitting: Family Medicine

## 2021-06-15 ENCOUNTER — Other Ambulatory Visit: Payer: Self-pay

## 2021-06-15 DIAGNOSIS — E782 Mixed hyperlipidemia: Secondary | ICD-10-CM | POA: Diagnosis not present

## 2021-06-15 DIAGNOSIS — M069 Rheumatoid arthritis, unspecified: Secondary | ICD-10-CM

## 2021-06-15 DIAGNOSIS — F32A Depression, unspecified: Secondary | ICD-10-CM | POA: Diagnosis not present

## 2021-06-15 DIAGNOSIS — E039 Hypothyroidism, unspecified: Secondary | ICD-10-CM

## 2021-06-15 MED ORDER — LEVOTHYROXINE SODIUM 88 MCG PO TABS: ORAL_TABLET | ORAL | 0 refills | Status: DC

## 2021-06-15 MED ORDER — ROSUVASTATIN CALCIUM 10 MG PO TABS: 10.0000 mg | ORAL_TABLET | Freq: Every day | ORAL | 3 refills | Status: DC

## 2021-06-15 NOTE — Progress Notes (Signed)
Subjective:  Patient ID: Kristy Moon, female    DOB: [birth year]  Age: 66 y.o. MRN: [id number]  CC: Chief Complaint  Patient presents with   Hypothyroidism   Hyperlipidemia    Med refills    HPI:  66 year old female with rheumatoid arthritis, osteopenia, depression, hyperlipidemia, hypothyroidism presents for follow-up.  Patient follows with rheumatology regarding rheumatoid arthritis.  She is currently on leflunomide and Celebrex.  Stable at this time.  Patient states that she has been out of her thyroid medication for nearly a month.  She is in need of refill.  Needs labs.  Patient has hyperlipidemia.  Uncontrolled.  Most recent LDL 161.  She is on pravastatin 10 mg daily.  Patient states that she suffers from depression.  She is currently off medication as she does not want to take it anymore.  She will monitor her symptoms closely.  She expresses a concern about ptosis per tickly of the right eye.  Would like to see a specialist.  Patient Active Problem List   Diagnosis Date Noted   RA (rheumatoid arthritis) ([place]) 06/15/2021   Osteopenia 10/15/2017   Chronic depression 04/01/2015   Hyperlipidemia 07/02/2013   Hypothyroidism 07/02/2013    Social Hx   Social History   Socioeconomic History   Marital status: Married    Spouse name: Not on file   Number of children: Not on file   Years of education: Not on file   Highest education level: Not on file  Occupational History   Not on file  Tobacco Use   Smoking status: Former    Packs/day: 1.50    Years: 10.00    Pack years: 15.00    Types: Cigarettes    Quit date: 03/19/1982    Years since quitting: 39.2   Smokeless tobacco: Never  Vaping Use   Vaping Use: Never used  Substance and Sexual Activity   Alcohol use: No    Alcohol/week: 0.0 standard drinks   Drug use: No   Sexual activity: Not on file  Other Topics Concern   Not on file  Social History Narrative   Not on file   Social Determinants of  Health   Financial Resource Strain: Not on file  Food Insecurity: Not on file  Transportation Needs: Not on file  Physical Activity: Not on file  Stress: Not on file  Social Connections: Not on file    Review of Systems Per HPI  Objective:  BP 110/82    Pulse (!) 105    Temp 98.3 F (36.8 C)    Ht 5\' 7"  (1.702 m)    Wt 158 lb (71.7 kg)    SpO2 97%    BMI 24.75 kg/m   BP/Weight 06/15/2021 10/20/2020 74/16/3845  Systolic BP 364 680 321  Diastolic BP 82 70 78  Wt. (Lbs) 158 154 168  BMI 24.75 24.12 26.31    Physical Exam Constitutional:      General: She is not in acute distress.    Appearance: Normal appearance. She is not ill-appearing.  HENT:     Head: Normocephalic and atraumatic.  Eyes:     Comments: No significant ptosis on exam.  Cardiovascular:     Rate and Rhythm: Normal rate and regular rhythm.  Pulmonary:     Effort: Pulmonary effort is normal.     Breath sounds: Normal breath sounds. No wheezing, rhonchi or rales.  Neurological:     Mental Status: She is alert.  Psychiatric:  Mood and Affect: Mood normal.        Behavior: Behavior normal.    Lab Results  Component Value Date   WBC 6.2 10/21/2020   HGB 13.2 10/21/2020   HCT 41.5 10/21/2020   PLT 311 10/21/2020   GLUCOSE 92 10/21/2020   CHOL 237 (H) 10/21/2020   TRIG 167 (H) 10/21/2020   HDL 45 10/21/2020   LDLCALC 161 (H) 10/21/2020   ALT 22 10/21/2020   AST 21 10/21/2020   NA 139 10/21/2020   K 5.0 10/21/2020   CL 101 10/21/2020   CREATININE 0.76 10/21/2020   BUN 15 10/21/2020   CO2 23 10/21/2020   TSH 0.740 12/17/2020     Assessment & Plan:   Problem List Items Addressed This Visit       Endocrine   Hypothyroidism    Refill patient's Synthroid.  Labs in 6 weeks after she has restarted the medication.      Relevant Medications   levothyroxine (SYNTHROID) 88 MCG tablet     Musculoskeletal and Integument   RA (rheumatoid arthritis) (HCC)    Stable.  Continue follow-up with  rheumatology.        Other   Hyperlipidemia    Uncontrolled.  Stopping pravastatin.  Starting Crestor.      Relevant Medications   rosuvastatin (CRESTOR) 10 MG tablet   Chronic depression    Stable at this time.  Off pharmacotherapy.  We will continue monitor.       Meds ordered this encounter  Medications   levothyroxine (SYNTHROID) 88 MCG tablet    Sig: Take 1 tab po 80mins prior to breakfast on emtpy stomach 6 days per week, skip sundays.    Dispense:  90 tablet    Refill:  0   rosuvastatin (CRESTOR) 10 MG tablet    Sig: Take 1 tablet (10 mg total) by mouth daily.    Dispense:  90 tablet    Refill:  3    Follow-up:  Return in about 6 months (around 12/13/2021).  [place]

## 2021-06-15 NOTE — Assessment & Plan Note (Signed)
Uncontrolled.  Stopping pravastatin.  Starting Crestor.

## 2021-06-15 NOTE — Patient Instructions (Signed)
Labs in 6 weeks.  Medications as prescribed.  Take care  Dr. Lacinda Axon

## 2021-06-15 NOTE — Assessment & Plan Note (Signed)
Refill patient's Synthroid.  Labs in 6 weeks after she has restarted the medication.

## 2021-06-15 NOTE — Assessment & Plan Note (Signed)
Stable.  Continue follow-up with rheumatology.

## 2021-06-15 NOTE — Assessment & Plan Note (Signed)
Stable at this time.  Off pharmacotherapy.  We will continue monitor.

## 2021-06-27 DIAGNOSIS — M0589 Other rheumatoid arthritis with rheumatoid factor of multiple sites: Secondary | ICD-10-CM | POA: Diagnosis not present

## 2021-06-27 DIAGNOSIS — M79645 Pain in left finger(s): Secondary | ICD-10-CM | POA: Diagnosis not present

## 2021-06-27 DIAGNOSIS — D72829 Elevated white blood cell count, unspecified: Secondary | ICD-10-CM | POA: Diagnosis not present

## 2021-08-08 ENCOUNTER — Telehealth: Payer: Self-pay

## 2021-08-08 NOTE — Telephone Encounter (Signed)
Patient states has started back on her lexapro 10 mg for about a month now, she states on her last visit she tried being w/o the medication and says cant be w/o it. She would like to have an additional lab order for hormones and metals to check the reason why . She is okay with scheduling an appt if that is needed. She request prescription for escitalopram/ [facility]  ?

## 2021-08-10 ENCOUNTER — Other Ambulatory Visit: Payer: Self-pay

## 2021-08-10 MED ORDER — ESCITALOPRAM OXALATE 10 MG PO TABS: 10.0000 mg | ORAL_TABLET | Freq: Every day | ORAL | 1 refills | Status: DC

## 2021-08-10 NOTE — Telephone Encounter (Signed)
Patient has been made aware of drs message regarding lab testing . Prescription sent to the pharmacy.  ?

## 2021-08-15 ENCOUNTER — Other Ambulatory Visit: Payer: Self-pay | Admitting: Family Medicine

## 2021-08-15 DIAGNOSIS — E039 Hypothyroidism, unspecified: Secondary | ICD-10-CM | POA: Diagnosis not present

## 2021-08-15 DIAGNOSIS — M069 Rheumatoid arthritis, unspecified: Secondary | ICD-10-CM | POA: Diagnosis not present

## 2021-08-15 DIAGNOSIS — E782 Mixed hyperlipidemia: Secondary | ICD-10-CM

## 2021-08-16 LAB — CMP14+EGFR
ALT: 20 IU/L (ref 0–32)
AST: 26 IU/L (ref 0–40)
Albumin/Globulin Ratio: 1.8 (ref 1.2–2.2)
Albumin: 4.2 g/dL (ref 3.8–4.8)
Alkaline Phosphatase: 70 IU/L (ref 44–121)
BUN/Creatinine Ratio: 12 (ref 12–28)
BUN: 9 mg/dL (ref 8–27)
Bilirubin Total: 0.4 mg/dL (ref 0.0–1.2)
CO2: 23 mmol/L (ref 20–29)
Calcium: 9.6 mg/dL (ref 8.7–10.3)
Chloride: 107 mmol/L — ABNORMAL HIGH (ref 96–106)
Creatinine, Ser: 0.77 mg/dL (ref 0.57–1.00)
Globulin, Total: 2.3 g/dL (ref 1.5–4.5)
Glucose: 83 mg/dL (ref 70–99)
Potassium: 4.7 mmol/L (ref 3.5–5.2)
Sodium: 144 mmol/L (ref 134–144)
Total Protein: 6.5 g/dL (ref 6.0–8.5)
eGFR: 86 mL/min/{1.73_m2} (ref >59)

## 2021-08-16 LAB — CBC
Hematocrit: 40.5 % (ref 34.0–46.6)
Hemoglobin: 13.1 g/dL (ref 11.1–15.9)
MCH: 31.4 pg (ref 26.6–33.0)
MCHC: 32.3 g/dL (ref 31.5–35.7)
MCV: 97 fL (ref 79–97)
Platelets: 282 10*3/uL (ref 150–450)
RBC: 4.17 x10E6/uL (ref 3.77–5.28)
RDW: 12 % (ref 11.7–15.4)
WBC: 4.7 10*3/uL (ref 3.4–10.8)

## 2021-08-16 LAB — LIPID PANEL
Chol/HDL Ratio: 2.9 ratio (ref 0.0–4.4)
Cholesterol, Total: 172 mg/dL (ref 100–199)
HDL: 60 mg/dL (ref >39)
LDL Chol Calc (NIH): 97 mg/dL (ref 0–99)
Triglycerides: 82 mg/dL (ref 0–149)
VLDL Cholesterol Cal: 15 mg/dL (ref 5–40)

## 2021-08-16 LAB — TSH: TSH: 2.04 u[IU]/mL (ref 0.450–4.500)

## 2021-09-12 ENCOUNTER — Other Ambulatory Visit: Payer: Self-pay

## 2021-09-12 MED ORDER — LEVOTHYROXINE SODIUM 88 MCG PO TABS: ORAL_TABLET | ORAL | 0 refills | Status: DC

## 2021-10-17 DIAGNOSIS — M0589 Other rheumatoid arthritis with rheumatoid factor of multiple sites: Secondary | ICD-10-CM | POA: Diagnosis not present

## 2021-12-19 ENCOUNTER — Other Ambulatory Visit: Payer: Self-pay | Admitting: Family Medicine

## 2021-12-21 DIAGNOSIS — J343 Hypertrophy of nasal turbinates: Secondary | ICD-10-CM | POA: Diagnosis not present

## 2021-12-21 DIAGNOSIS — J310 Chronic rhinitis: Secondary | ICD-10-CM | POA: Diagnosis not present

## 2021-12-22 ENCOUNTER — Other Ambulatory Visit: Payer: Self-pay

## 2021-12-22 MED ORDER — LEVOTHYROXINE SODIUM 88 MCG PO TABS: ORAL_TABLET | ORAL | 0 refills | Status: DC

## 2021-12-29 DIAGNOSIS — M0589 Other rheumatoid arthritis with rheumatoid factor of multiple sites: Secondary | ICD-10-CM | POA: Diagnosis not present

## 2021-12-29 DIAGNOSIS — D72829 Elevated white blood cell count, unspecified: Secondary | ICD-10-CM | POA: Diagnosis not present

## 2021-12-29 DIAGNOSIS — M79645 Pain in left finger(s): Secondary | ICD-10-CM | POA: Diagnosis not present

## 2021-12-29 DIAGNOSIS — Z6824 Body mass index (BMI) 24.0-24.9, adult: Secondary | ICD-10-CM | POA: Diagnosis not present

## 2022-01-16 DIAGNOSIS — H532 Diplopia: Secondary | ICD-10-CM | POA: Diagnosis not present

## 2022-01-16 DIAGNOSIS — H35363 Drusen (degenerative) of macula, bilateral: Secondary | ICD-10-CM | POA: Diagnosis not present

## 2022-01-16 DIAGNOSIS — Z961 Presence of intraocular lens: Secondary | ICD-10-CM | POA: Diagnosis not present

## 2022-01-23 DIAGNOSIS — H524 Presbyopia: Secondary | ICD-10-CM | POA: Diagnosis not present

## 2022-02-28 ENCOUNTER — Other Ambulatory Visit (HOSPITAL_COMMUNITY): Payer: Self-pay | Admitting: Family Medicine

## 2022-02-28 DIAGNOSIS — Z1231 Encounter for screening mammogram for malignant neoplasm of breast: Secondary | ICD-10-CM

## 2022-03-13 ENCOUNTER — Ambulatory Visit (HOSPITAL_COMMUNITY)
Admission: RE | Admit: 2022-03-13 | Discharge: 2022-03-13 | Disposition: A | Discharge status: Home / Self Care | Payer: Medicare Other | Source: Ambulatory Visit | Attending: Family Medicine | Admitting: Family Medicine

## 2022-03-13 DIAGNOSIS — Z1231 Encounter for screening mammogram for malignant neoplasm of breast: Secondary | ICD-10-CM | POA: Insufficient documentation

## 2022-03-14 ENCOUNTER — Encounter: Payer: BC Managed Care – PPO | Admitting: Nurse Practitioner

## 2022-03-14 ENCOUNTER — Ambulatory Visit (INDEPENDENT_AMBULATORY_CARE_PROVIDER_SITE_OTHER): Payer: Medicare Other | Admitting: Nurse Practitioner

## 2022-03-14 ENCOUNTER — Encounter: Payer: Self-pay | Admitting: Nurse Practitioner

## 2022-03-14 VITALS — BP 102/60 | HR 68 | Temp 97.2°F | Ht 67.0 in | Wt 161.0 lb

## 2022-03-14 DIAGNOSIS — Z0000 Encounter for general adult medical examination without abnormal findings: Secondary | ICD-10-CM

## 2022-03-14 NOTE — Progress Notes (Signed)
   Subjective:    Patient ID: Kristy Moon, female    DOB: [birth year], 66 y.o.   MRN: [id number]  HPI  AWV- Annual Wellness Visit  The patient was seen for their annual wellness visit. The patient's past medical history, surgical history, and family history were reviewed. Pertinent vaccines were reviewed ( tetanus, pneumonia, shingles, flu) The patient's medication list was reviewed and updated.  The height and weight were entered.  BMI recorded in electronic record elsewhere  Cognitive screening was completed. Outcome of Mini - Cog: 5   Falls /depression screening electronically recorded within record elsewhere  Current tobacco usage:no (All patients who use tobacco were given written and verbal information on quitting)  Recent listing of emergency department/hospitalizations over the past year were reviewed.  current specialist the patient sees on a regular basis: rheumatology   Medicare annual wellness visit patient questionnaire was reviewed.  A written screening schedule for the patient for the next 5-10 years was given. Appropriate discussion of followup regarding next visit was discussed.     Review of Systems  All other systems reviewed and are negative.      Objective:   Physical Exam Vitals reviewed.  Constitutional:      General: She is not in acute distress.    Appearance: Normal appearance. She is normal weight. She is not ill-appearing, toxic-appearing or diaphoretic.  HENT:     Head: Normocephalic and atraumatic.  Neurological:     Mental Status: She is alert.  Psychiatric:        Mood and Affect: Mood normal.        Behavior: Behavior normal.           Assessment & Plan:   1. Medicare annual wellness visit, initial Adult wellness-complete.wellness physical was conducted today. Importance of diet and exercise were discussed in detail.  Importance of stress reduction and healthy living were discussed.  In addition to this a discussion  regarding safety was also covered.  We also reviewed over immunizations and gave recommendations regarding current immunization needed for age.   In addition to this additional areas were also touched on including: Preventative health exams needed:  Colonoscopy up to date due 04/21/2026 PAP up to date. No longer indicated Mammogram up to date due 03/14/2023 Pneumonia vaccine after the Holiday Flu vaccine after the Holiday Shingles vaccine 4 weeks after Flu and Pneumonia vaccine  Make appointment to see Dr. Lacinda Axon for: - Sweating and body odor - Shortness of breath with exertion. If symptoms worsen return to clinic sooner or go to ED.  Patient was advised yearly wellness exam

## 2022-03-14 NOTE — Patient Instructions (Addendum)
Thank you for coming for your annual wellness visit.  Please follow through on any advice that was given to you by today's visit. Remember to maintain compliance with your medications as discussed today.  Also remember it is important to eat a healthy diet and to stay physically active on a daily basis.  Please follow through with any testing or recommended followup office visits as was discussed today. You are due the following test coming up:  Colonoscopy up to date due 04/21/2026 PAP up to date. No longer indicated Mammogram up to date due 03/14/2023 Pneumonia vaccine after the Holiday Flu vaccine after the Holiday Shingles vaccine 4 weeks after Flu and Pneumonia vaccine  Make follow up appointment to see Dr. Lacinda Axon for medical concerns      Finally remembered that the annual wellness visit does not take the place of regularly scheduled office visits  chronic health problems such as hypertension/diabetes/cholesterol visits.

## 2022-04-19 ENCOUNTER — Ambulatory Visit: Payer: Medicare Other | Admitting: Family Medicine

## 2022-04-25 DIAGNOSIS — M0589 Other rheumatoid arthritis with rheumatoid factor of multiple sites: Secondary | ICD-10-CM | POA: Diagnosis not present

## 2022-05-23 ENCOUNTER — Telehealth: Payer: Self-pay | Admitting: Family Medicine

## 2022-05-23 MED ORDER — LEVOTHYROXINE SODIUM 88 MCG PO TABS: ORAL_TABLET | ORAL | 0 refills | Status: DC

## 2022-05-23 NOTE — Telephone Encounter (Signed)
Prescription sent electronically to pharmacy. 

## 2022-05-23 NOTE — Telephone Encounter (Signed)
Refill request for L-thyroxine tabs 88MCG  [facility]

## 2022-06-05 ENCOUNTER — Other Ambulatory Visit: Payer: Self-pay

## 2022-06-05 MED ORDER — LEVOTHYROXINE SODIUM 88 MCG PO TABS: ORAL_TABLET | ORAL | 0 refills | Status: DC

## 2022-07-18 DIAGNOSIS — M0589 Other rheumatoid arthritis with rheumatoid factor of multiple sites: Secondary | ICD-10-CM | POA: Diagnosis not present

## 2022-07-18 DIAGNOSIS — M79644 Pain in right finger(s): Secondary | ICD-10-CM | POA: Diagnosis not present

## 2022-07-18 DIAGNOSIS — Z6825 Body mass index (BMI) 25.0-25.9, adult: Secondary | ICD-10-CM | POA: Diagnosis not present

## 2022-07-18 DIAGNOSIS — M180 Bilateral primary osteoarthritis of first carpometacarpal joints: Secondary | ICD-10-CM | POA: Diagnosis not present

## 2022-07-18 DIAGNOSIS — M79645 Pain in left finger(s): Secondary | ICD-10-CM | POA: Diagnosis not present

## 2022-07-18 DIAGNOSIS — D72829 Elevated white blood cell count, unspecified: Secondary | ICD-10-CM | POA: Diagnosis not present

## 2022-08-02 DIAGNOSIS — K080 Exfoliation of teeth due to systemic causes: Secondary | ICD-10-CM | POA: Diagnosis not present

## 2022-08-24 DIAGNOSIS — I781 Nevus, non-neoplastic: Secondary | ICD-10-CM | POA: Diagnosis not present

## 2022-09-27 ENCOUNTER — Other Ambulatory Visit: Payer: Self-pay | Admitting: Family Medicine

## 2022-10-02 ENCOUNTER — Other Ambulatory Visit: Payer: Self-pay | Admitting: Family Medicine

## 2022-11-28 DIAGNOSIS — Z6825 Body mass index (BMI) 25.0-25.9, adult: Secondary | ICD-10-CM | POA: Diagnosis not present

## 2022-12-19 ENCOUNTER — Ambulatory Visit (INDEPENDENT_AMBULATORY_CARE_PROVIDER_SITE_OTHER): Payer: Medicare Other | Admitting: Family Medicine

## 2022-12-19 VITALS — BP 116/68 | HR 66 | Temp 97.9°F | Ht 67.0 in | Wt 159.8 lb

## 2022-12-19 DIAGNOSIS — E039 Hypothyroidism, unspecified: Secondary | ICD-10-CM

## 2022-12-19 DIAGNOSIS — Z79899 Other long term (current) drug therapy: Secondary | ICD-10-CM

## 2022-12-19 DIAGNOSIS — F32A Depression, unspecified: Secondary | ICD-10-CM

## 2022-12-19 DIAGNOSIS — M069 Rheumatoid arthritis, unspecified: Secondary | ICD-10-CM | POA: Diagnosis not present

## 2022-12-19 DIAGNOSIS — Z23 Encounter for immunization: Secondary | ICD-10-CM | POA: Diagnosis not present

## 2022-12-19 DIAGNOSIS — E782 Mixed hyperlipidemia: Secondary | ICD-10-CM | POA: Diagnosis not present

## 2022-12-19 MED ORDER — LEVOTHYROXINE SODIUM 88 MCG PO TABS: ORAL_TABLET | ORAL | 3 refills | Status: DC

## 2022-12-19 MED ORDER — ROSUVASTATIN CALCIUM 10 MG PO TABS: 10.0000 mg | ORAL_TABLET | Freq: Every day | ORAL | 3 refills | Status: DC

## 2022-12-19 NOTE — Patient Instructions (Signed)
Continue your medications.  Discuss chronic NSAID use with rheumatology.  Follow up in 6 months.

## 2022-12-20 ENCOUNTER — Ambulatory Visit: Payer: Medicare Other | Admitting: Family Medicine

## 2022-12-20 LAB — CMP14+EGFR
ALT: 25 IU/L (ref 0–32)
AST: 26 IU/L (ref 0–40)
Albumin: 4.3 g/dL (ref 3.9–4.9)
Alkaline Phosphatase: 87 IU/L (ref 44–121)
BUN/Creatinine Ratio: 16 (ref 12–28)
BUN: 15 mg/dL (ref 8–27)
Bilirubin Total: 0.4 mg/dL (ref 0.0–1.2)
CO2: 24 mmol/L (ref 20–29)
Calcium: 10.1 mg/dL (ref 8.7–10.3)
Chloride: 103 mmol/L (ref 96–106)
Creatinine, Ser: 0.92 mg/dL (ref 0.57–1.00)
Globulin, Total: 2.7 g/dL (ref 1.5–4.5)
Glucose: 90 mg/dL (ref 70–99)
Potassium: 5.6 mmol/L — ABNORMAL HIGH (ref 3.5–5.2)
Sodium: 139 mmol/L (ref 134–144)
Total Protein: 7 g/dL (ref 6.0–8.5)
eGFR: 69 mL/min/{1.73_m2} (ref >59)

## 2022-12-20 LAB — CBC
Hematocrit: 40.3 % (ref 34.0–46.6)
Hemoglobin: 13.5 g/dL (ref 11.1–15.9)
MCH: 32.8 pg (ref 26.6–33.0)
MCHC: 33.5 g/dL (ref 31.5–35.7)
MCV: 98 fL — ABNORMAL HIGH (ref 79–97)
Platelets: 294 10*3/uL (ref 150–450)
RBC: 4.12 x10E6/uL (ref 3.77–5.28)
RDW: 11.7 % (ref 11.7–15.4)
WBC: 7.1 10*3/uL (ref 3.4–10.8)

## 2022-12-20 LAB — LIPID PANEL
Chol/HDL Ratio: 2.5 ratio (ref 0.0–4.4)
Cholesterol, Total: 145 mg/dL (ref 100–199)
HDL: 57 mg/dL (ref >39)
LDL Chol Calc (NIH): 69 mg/dL (ref 0–99)
Triglycerides: 104 mg/dL (ref 0–149)
VLDL Cholesterol Cal: 19 mg/dL (ref 5–40)

## 2022-12-20 LAB — TSH+FREE T4
Free T4: 1.31 ng/dL (ref 0.82–1.77)
TSH: 0.479 u[IU]/mL (ref 0.450–4.500)

## 2022-12-20 MED ORDER — ESOMEPRAZOLE MAGNESIUM 20 MG PO CPDR: 20.0000 mg | DELAYED_RELEASE_CAPSULE | Freq: Every day | ORAL | 1 refills | Status: DC

## 2022-12-20 MED ORDER — OMEPRAZOLE 20 MG PO CPDR
20.0000 mg | DELAYED_RELEASE_CAPSULE | Freq: Every day | ORAL | 1 refills | Status: DC
Start: 2022-12-20 — End: 2022-12-20

## 2022-12-20 NOTE — Progress Notes (Signed)
Subjective:  Patient ID: Kristy Moon, female    DOB: January 17, 1956  Age: 67 y.o. MRN: [id number]  CC:  Follow up   HPI:  67 year old female with rheumatoid arthritis, hypothyroidism, osteopenia, depression, hyperlipidemia presents for follow-up.  Patient states overall she is doing well. Joint pain is well controlled on Arava and Celebrex. She has been on Celebrex for a few years. Will discuss harms of chronic NSAID use today.  She follows with rheumatology.  In regards to her preventative health care, she needs flu vaccine, pneumonia vaccine, COVID-19 booster, and Shingrix.  Declines flu vaccine and COVID booster.  Advised that she can get her Shingrix at the pharmacy.  She is amenable to pneumococcal vaccine today.  Needs labs today to reassess lipids as well as hypothyroidism.   Patient Active Problem List   Diagnosis Date Noted   RA (rheumatoid arthritis) (HCC) 06/15/2021   Osteopenia 10/15/2017   Chronic depression 04/01/2015   Hyperlipidemia 07/02/2013   Hypothyroidism 07/02/2013    Social Hx   Social History   Socioeconomic History   Marital status: Married    Spouse name: Not on file   Number of children: Not on file   Years of education: Not on file   Highest education level: Not on file  Occupational History   Not on file  Tobacco Use   Smoking status: Former    Current packs/day: 0.00    Average packs/day: 1.5 packs/day for 10.0 years (15.0 ttl pk-yrs)    Types: Cigarettes    Start date: 03/19/1972    Quit date: 03/19/1982    Years since quitting: 40.7   Smokeless tobacco: Never  Vaping Use   Vaping status: Never Used  Substance and Sexual Activity   Alcohol use: No    Alcohol/week: 0.0 standard drinks of alcohol   Drug use: No   Sexual activity: Not on file  Other Topics Concern   Not on file  Social History Narrative   Not on file   Social Determinants of Health   Financial Resource Strain: Not on file  Food Insecurity: Not on file   Transportation Needs: Not on file  Physical Activity: Not on file  Stress: Not on file  Social Connections: Unknown (02/01/2022)   Received from [employer] [facility]   Family and [employer] Support    Help with Day-to-Day Activities: Not on file    Lonely or Isolated: Not on file    Review of Systems  Respiratory: Negative.    Cardiovascular: Negative.    Objective:  BP 116/68   Pulse 66   Temp 97.9 F (36.6 C)   Ht 5\' 7"  (1.702 m)   Wt 159 lb 12.8 oz (72.5 kg)   SpO2 95%   BMI 25.03 kg/m      12/19/2022    3:55 PM 03/14/2022    3:03 PM 06/15/2021    1:12 PM  BP/Weight  Systolic BP 116 102 110  Diastolic BP 68 60 82  Wt. (Lbs) 159.8 161 158  BMI 25.03 kg/m2 25.22 kg/m2 24.75 kg/m2    Physical Exam Vitals and nursing note reviewed.  Constitutional:      General: She is not in acute distress.    Appearance: Normal appearance.  HENT:     Head: Normocephalic and atraumatic.  Eyes:     General:        Right eye: No discharge.        Left eye: No discharge.  Conjunctiva/sclera: Conjunctivae normal.  Cardiovascular:     Rate and Rhythm: Normal rate and regular rhythm.  Pulmonary:     Effort: Pulmonary effort is normal.     Breath sounds: Normal breath sounds. No wheezing, rhonchi or rales.  Neurological:     Mental Status: She is alert.  Psychiatric:        Mood and Affect: Mood normal.        Behavior: Behavior normal.     Lab Results  Component Value Date   WBC 7.1 12/19/2022   HGB 13.5 12/19/2022   HCT 40.3 12/19/2022   PLT 294 12/19/2022   GLUCOSE 90 12/19/2022   CHOL 145 12/19/2022   TRIG 104 12/19/2022   HDL 57 12/19/2022   LDLCALC 69 12/19/2022   ALT 25 12/19/2022   AST 26 12/19/2022   NA 139 12/19/2022   K 5.6 (H) 12/19/2022   CL 103 12/19/2022   CREATININE 0.92 12/19/2022   BUN 15 12/19/2022   CO2 24 12/19/2022   TSH 0.479 12/19/2022     Assessment & Plan:   Problem List Items Addressed This  Visit       Endocrine   Hypothyroidism    TSH WNL. Continue current dosing of Synthroid.      Relevant Medications   levothyroxine (SYNTHROID) 88 MCG tablet   Other Relevant Orders   TSH + free T4 (Completed)     Musculoskeletal and Integument   RA (rheumatoid arthritis) (HCC) - Primary    Well-controlled.  Discussed the potential harms of chronic NSAID use.  Advised to discuss this with rheumatology. Placing on PPI for GI ppx.        Other   Chronic depression    Stable on Lexapro. Continue.      Hyperlipidemia    LDL returned at goal.  Continue Crestor.      Relevant Medications   rosuvastatin (CRESTOR) 10 MG tablet   Other Relevant Orders   Lipid panel (Completed)   Other Visit Diagnoses     Need for vaccination       Relevant Orders   Pneumococcal conjugate vaccine 20-valent (Prevnar 20) (Completed)   High risk medication use       Relevant Orders   CBC (Completed)   CMP14+EGFR (Completed)       Meds ordered this encounter  Medications   levothyroxine (SYNTHROID) 88 MCG tablet    Sig: TAKE 1 TABLET 30 MINUTES PRIOR TO BREAKFAST ON AN EMPTY STOMACH 6 DAYS PER WEEK, SKIP SUNDAYS    Dispense:  90 tablet    Refill:  3   rosuvastatin (CRESTOR) 10 MG tablet    Sig: Take 1 tablet (10 mg total) by mouth daily.    Dispense:  90 tablet    Refill:  3   DISCONTD: omeprazole (PRILOSEC) 20 MG capsule    Sig: Take 1 capsule (20 mg total) by mouth daily.    Dispense:  90 capsule    Refill:  1   esomeprazole (NEXIUM) 20 MG capsule    Sig: Take 1 capsule (20 mg total) by mouth daily.    Dispense:  90 capsule    Refill:  1    Please disregard Rx for Omeprazole. Fill this Rx please.    Follow-up:  6 months  Charletta Voight Adriana Simas DO [facility]

## 2022-12-20 NOTE — Assessment & Plan Note (Signed)
TSH WNL. Continue current dosing of Synthroid.

## 2022-12-20 NOTE — Assessment & Plan Note (Signed)
LDL returned at goal.  Continue Crestor.

## 2022-12-20 NOTE — Assessment & Plan Note (Signed)
Stable on Lexapro.  Continue. 

## 2022-12-20 NOTE — Assessment & Plan Note (Signed)
Well-controlled.  Discussed the potential harms of chronic NSAID use.  Advised to discuss this with rheumatology. Placing on PPI for GI ppx.

## 2022-12-26 ENCOUNTER — Other Ambulatory Visit: Payer: Self-pay | Admitting: Family Medicine

## 2022-12-27 ENCOUNTER — Other Ambulatory Visit: Payer: Self-pay

## 2022-12-27 DIAGNOSIS — E875 Hyperkalemia: Secondary | ICD-10-CM

## 2023-01-04 DIAGNOSIS — E875 Hyperkalemia: Secondary | ICD-10-CM | POA: Diagnosis not present

## 2023-01-05 LAB — BASIC METABOLIC PANEL
BUN/Creatinine Ratio: 13 (ref 12–28)
BUN: 12 mg/dL (ref 8–27)
CO2: 22 mmol/L (ref 20–29)
Calcium: 9.5 mg/dL (ref 8.7–10.3)
Chloride: 104 mmol/L (ref 96–106)
Creatinine, Ser: 0.89 mg/dL (ref 0.57–1.00)
Glucose: 96 mg/dL (ref 70–99)
Potassium: 4.4 mmol/L (ref 3.5–5.2)
Sodium: 143 mmol/L (ref 134–144)
eGFR: 71 mL/min/{1.73_m2} (ref >59)

## 2023-01-08 ENCOUNTER — Other Ambulatory Visit: Payer: Self-pay | Admitting: Family Medicine

## 2023-01-15 ENCOUNTER — Telehealth: Payer: Self-pay

## 2023-01-15 ENCOUNTER — Other Ambulatory Visit: Payer: Self-pay

## 2023-01-15 MED ORDER — ROSUVASTATIN CALCIUM 10 MG PO TABS: 10.0000 mg | ORAL_TABLET | Freq: Every day | ORAL | 3 refills | Status: DC

## 2023-01-15 NOTE — Telephone Encounter (Signed)
Prescription Request  01/15/2023  LOV: Visit date not found  What is the name of the medication or equipment? rosuvastatin (CRESTOR) 10 MG tablet levothyroxine (SYNTHROID) 88 MCG tablet   Have you contacted your pharmacy to request a refill? Yes   Which pharmacy would you like this sent to[facility]   Patient notified that their request is being sent to the clinical staff for review and that they should receive a response within 2 business days.   Please advise at Mobile [phone] (mobile)

## 2023-01-16 DIAGNOSIS — M79644 Pain in right finger(s): Secondary | ICD-10-CM | POA: Diagnosis not present

## 2023-01-16 DIAGNOSIS — M0589 Other rheumatoid arthritis with rheumatoid factor of multiple sites: Secondary | ICD-10-CM | POA: Diagnosis not present

## 2023-01-16 DIAGNOSIS — M79645 Pain in left finger(s): Secondary | ICD-10-CM | POA: Diagnosis not present

## 2023-01-16 DIAGNOSIS — D72829 Elevated white blood cell count, unspecified: Secondary | ICD-10-CM | POA: Diagnosis not present

## 2023-01-22 DIAGNOSIS — Z961 Presence of intraocular lens: Secondary | ICD-10-CM | POA: Diagnosis not present

## 2023-01-22 DIAGNOSIS — H35363 Drusen (degenerative) of macula, bilateral: Secondary | ICD-10-CM | POA: Diagnosis not present

## 2023-02-13 DIAGNOSIS — K080 Exfoliation of teeth due to systemic causes: Secondary | ICD-10-CM | POA: Diagnosis not present

## 2023-02-19 DIAGNOSIS — K080 Exfoliation of teeth due to systemic causes: Secondary | ICD-10-CM | POA: Diagnosis not present

## 2023-03-07 DIAGNOSIS — M0589 Other rheumatoid arthritis with rheumatoid factor of multiple sites: Secondary | ICD-10-CM | POA: Diagnosis not present

## 2023-04-05 ENCOUNTER — Telehealth: Payer: Self-pay

## 2023-04-05 MED ORDER — ROSUVASTATIN CALCIUM 10 MG PO TABS: 10.0000 mg | ORAL_TABLET | Freq: Every day | ORAL | 1 refills | Status: DC

## 2023-04-05 NOTE — Telephone Encounter (Signed)
Received via fax Rx request: Prescription sent electronically to pharmacy  

## 2023-04-05 NOTE — Telephone Encounter (Signed)
Prescription Request  04/05/2023  LOV: Visit date not found  What is the name of the medication or equipment? rosuvastatin (CRESTOR) 10 MG tablet   Have you contacted your pharmacy to request a refill? Yes   Which pharmacy would you like this sent to[facility]    Patient notified that their request is being sent to the clinical staff for review and that they should receive a response within 2 business days.   Please advise at Mobile [phone] (mobile)

## 2023-06-19 DIAGNOSIS — M180 Bilateral primary osteoarthritis of first carpometacarpal joints: Secondary | ICD-10-CM | POA: Diagnosis not present

## 2023-06-19 DIAGNOSIS — M79645 Pain in left finger(s): Secondary | ICD-10-CM | POA: Diagnosis not present

## 2023-06-19 DIAGNOSIS — M79644 Pain in right finger(s): Secondary | ICD-10-CM | POA: Diagnosis not present

## 2023-06-19 DIAGNOSIS — D72829 Elevated white blood cell count, unspecified: Secondary | ICD-10-CM | POA: Diagnosis not present

## 2023-06-19 DIAGNOSIS — M0589 Other rheumatoid arthritis with rheumatoid factor of multiple sites: Secondary | ICD-10-CM | POA: Diagnosis not present

## 2023-07-27 ENCOUNTER — Ambulatory Visit: Payer: Medicare Other

## 2023-08-30 DIAGNOSIS — K080 Exfoliation of teeth due to systemic causes: Secondary | ICD-10-CM | POA: Diagnosis not present

## 2023-09-21 ENCOUNTER — Ambulatory Visit

## 2023-09-21 VITALS — Ht 67.0 in | Wt 159.0 lb

## 2023-09-21 DIAGNOSIS — Z780 Asymptomatic menopausal state: Secondary | ICD-10-CM | POA: Diagnosis not present

## 2023-09-21 DIAGNOSIS — Z0000 Encounter for general adult medical examination without abnormal findings: Secondary | ICD-10-CM

## 2023-09-21 DIAGNOSIS — Z1231 Encounter for screening mammogram for malignant neoplasm of breast: Secondary | ICD-10-CM

## 2023-09-21 NOTE — Progress Notes (Signed)
 Subjective:   Kristy Moon is a 69 y.o. who presents for a Medicare Wellness preventive visit.  As a reminder, Annual Wellness Visits don't include a physical exam, and some assessments may be limited, especially if this visit is performed virtually. We may recommend an in-person follow-up visit with your provider if needed.  Visit Complete: Virtual I connected with  Kristy Moon on 09/21/23 by a audio enabled telemedicine application and verified that I am speaking with the correct person using two identifiers.  Patient Location: Home  Provider Location: Home Office  I discussed the limitations of evaluation and management by telemedicine. The patient expressed understanding and agreed to proceed.  Vital Signs: Because this visit was a virtual/telehealth visit, some criteria may be missing or patient reported. Any vitals not documented were not able to be obtained and vitals that have been documented are patient reported.  VideoDeclined- This patient declined Librarian, academic. Therefore the visit was completed with audio only.  Persons Participating in Visit: Patient.  AWV Questionnaire: No: Patient Medicare AWV questionnaire was not completed prior to this visit.  Cardiac Risk Factors include: advanced age (>72men, >57 women);dyslipidemia     Objective:     Today's Vitals   09/21/23 1351  Weight: 159 lb (72.1 kg)  Height: 5\' 7"  (1.702 m)   Body mass index is 24.9 kg/m.     09/21/2023    2:31 PM 04/21/2016   10:04 AM  Advanced Directives  Does Patient Have a Medical Advance Directive? No No  Would patient like information on creating a medical advance directive? Yes (MAU/Ambulatory/Procedural Areas - Information given) No - Patient declined    Current Medications (verified) Outpatient Encounter Medications as of 09/21/2023  Medication Sig   celecoxib (CELEBREX) 200 MG capsule Take 200 mg by mouth daily.    Cholecalciferol  (VITAMIN D3) 2000 units capsule Take 2,000 Units by mouth daily.   escitalopram  (LEXAPRO ) 10 MG tablet TAKE 1 TABLET DAILY   esomeprazole  (NEXIUM ) 20 MG capsule Take 1 capsule (20 mg total) by mouth daily.   ibuprofen (ADVIL,MOTRIN) 200 MG tablet Take 600 mg by mouth every 4 (four) hours as needed for headache or moderate pain.   Krill Oil 500 MG CAPS Take 500 mg by mouth daily.   leflunomide (ARAVA) 20 MG tablet Take 20 mg by mouth daily.   levothyroxine  (SYNTHROID ) 88 MCG tablet TAKE 1 TABLET 30 MINUTES PRIOR TO BREAKFAST ON AN EMPTY STOMACH 6 DAYS PER WEEK, SKIP SUNDAYS   Melatonin 5 MG TABS Take 5 mg by mouth at bedtime.   rosuvastatin  (CRESTOR ) 10 MG tablet Take 1 tablet (10 mg total) by mouth daily.   No facility-administered encounter medications on file as of 09/21/2023.    Allergies (verified) Patient has no known allergies.   History: Past Medical History:  Diagnosis Date   Depression    GERD (gastroesophageal reflux disease)    Hyperlipidemia    Hypothyroidism    IBS (irritable bowel syndrome)    Vocal cord nodule    Past Surgical History:  Procedure Laterality Date   COLONOSCOPY N/A 04/21/2016   Procedure: COLONOSCOPY;  Surgeon: Alyce Jubilee, MD;  Location[facility];  Service: Endoscopy;  Laterality: N/A;  10:30 Am   EYE SURGERY Bilateral    Family History  Problem Relation Age of Onset   Diabetes Other    Social History   Socioeconomic History   Marital status: Married    Spouse name: Not  on file   Number of children: Not on file   Years of education: Not on file   Highest education level: Not on file  Occupational History   Not on file  Tobacco Use   Smoking status: Former    Current packs/day: 0.00    Average packs/day: 1.5 packs/day for 10.0 years (15.0 ttl pk-yrs)    Types: Cigarettes    Start date: 03/19/1972    Quit date: 03/19/1982    Years since quitting: 41.5   Smokeless tobacco: Never  Vaping Use   Vaping status: Never Used   Substance and Sexual Activity   Alcohol use: No    Alcohol/week: 0.0 standard drinks of alcohol   Drug use: No   Sexual activity: Not on file  Other Topics Concern   Not on file  Social History Narrative   Not on file   Social Drivers of Health   Financial Resource Strain: Low Risk  (09/21/2023)   Overall Financial Resource Strain (CARDIA)    Difficulty of Paying Living Expenses: Not hard at all  Food Insecurity: No Food Insecurity (09/21/2023)   Hunger Vital Sign    Worried About Running Out of Food in the Last Year: Never true    Ran Out of Food in the Last Year: Never true  Transportation Needs: No Transportation Needs (09/21/2023)   PRAPARE - Administrator, Civil Service (Medical): No    Lack of Transportation (Non-Medical): No  Physical Activity: Insufficiently Active (09/21/2023)   Exercise Vital Sign    Days of Exercise per Week: 3 days    Minutes of Exercise per Session: 30 min  Stress: No Stress Concern Present (09/21/2023)   [employer] of Occupational Health - Occupational Stress Questionnaire    Feeling of Stress : Not at all  Social Connections: Socially Integrated (09/21/2023)   Social Connection and Isolation Panel [NHANES]    Frequency of Communication with Friends and Family: More than three times a week    Frequency of Social Gatherings with Friends and Family: Three times a week    Attends Religious Services: More than 4 times per year    Active Member of Clubs or Organizations: Yes    Attends Engineer, structural: More than 4 times per year    Marital Status: Married    Tobacco Counseling Counseling given: Not Answered    Clinical Intake:  Pre-visit preparation completed: Yes  Pain : No/denies pain     Diabetes: No  No results found for: "HGBA1C"   How often do you need to have someone help you when you read instructions, pamphlets, or other written materials from your doctor or pharmacy?: 1 - Never  Interpreter  Needed?: No  Information entered by :: Seabron Cypress LPN   Activities of Daily Living     09/21/2023    2:31 PM  In your present state of health, do you have any difficulty performing the following activities:  Hearing? 0  Vision? 0  Difficulty concentrating or making decisions? 0  Walking or climbing stairs? 0  Dressing or bathing? 0  Doing errands, shopping? 0  Preparing Food and eating ? N  Using the Toilet? N  In the past six months, have you accidently leaked urine? N  Do you have problems with loss of bowel control? N  Managing your Medications? N  Managing your Finances? N  Housekeeping or managing your Housekeeping? N    Patient Care Team: Cook, Jayce G, DO as PCP -  General (Family Medicine) Eulalia Heys, Deanna M, PA-C (Physician Assistant) [facility]  Indicate any recent Medical Services you may have received from other than [facility] providers in the past year (date may be approximate).     Assessment:    This is a routine wellness examination for [place].  Hearing/Vision screen Hearing Screening - Comments:: Denies hearing difficulties   Vision Screening - Comments:: Wears rx glasses - up to date with routine eye exams with [facility]    Goals Addressed             This Visit's Progress    Remain active and independent         Depression Screen     09/21/2023    2:30 PM 12/19/2022    4:04 PM 03/14/2022    3:39 PM 10/20/2020    4:05 PM 02/03/2020    2:36 PM 05/22/2018    2:13 PM 10/08/2017    9:22 AM  PHQ 2/9 Scores  PHQ - 2 Score 0 0 0 0 0 0   PHQ- 9 Score  6 11 0  3   Exception Documentation       Patient refusal    Fall Risk     09/21/2023    2:31 PM 12/19/2022    4:04 PM 03/14/2022    3:39 PM 02/03/2020    1:43 PM  Fall Risk   Falls in the past year? 0 0 0 1  Number falls in past yr: 0  0 1  Injury with Fall? 0  0 0  Risk for fall due to : No Fall Risks  No Fall Risks   Follow up Falls prevention  discussed;Education provided;Falls evaluation completed  Falls evaluation completed Falls evaluation completed    MEDICARE RISK AT HOME:  Medicare Risk at Home Any stairs in or around the home?: No If so, are there any without handrails?: No Home free of loose throw rugs in walkways, pet beds, electrical cords, etc?: Yes Adequate lighting in your home to reduce risk of falls?: Yes Life alert?: No Use of a cane, walker or w/c?: No Grab bars in the bathroom?: Yes Shower chair or bench in shower?: Yes Elevated toilet seat or a handicapped toilet?: Yes  TIMED UP AND GO:  Was the test performed?  No  Cognitive Function: 6CIT completed        09/21/2023    2:31 PM  6CIT Screen  What Year? 0 points  What month? 0 points  What time? 0 points  Count back from 20 0 points  Months in reverse 0 points  Repeat phrase 0 points  Total Score 0 points    Immunizations Immunization History  Administered Date(s) Administered   Influenza,inj,Quad PF,6+ Mos 04/01/2015   Influenza-Unspecified 02/23/2018   Moderna Sars-Covid-2 Vaccination 07/12/2019, 08/12/2019   PNEUMOCOCCAL CONJUGATE-20 12/19/2022   Tdap 07/17/2013    Screening Tests Health Maintenance  Topic Date Due   Zoster Vaccines- Shingrix (1 of 2) Never done   COVID-19 Vaccine (3 - Moderna risk series) 09/09/2019   DTaP/Tdap/Td (2 - Td or Tdap) 07/18/2023   INFLUENZA VACCINE  11/23/2023   MAMMOGRAM  03/13/2024   Medicare Annual Wellness (AWV)  09/20/2024   Colonoscopy  04/21/2026   Pneumonia Vaccine 54+ Years old  Completed   DEXA SCAN  Completed   Hepatitis C Screening  Completed   HPV VACCINES  Aged Out   Meningococcal B Vaccine  Aged Out    Health Maintenance  Health Maintenance Due  Topic Date Due   Zoster Vaccines- Shingrix (1 of 2) Never done   COVID-19 Vaccine (3 - Moderna risk series) 09/09/2019   DTaP/Tdap/Td (2 - Td or Tdap) 07/18/2023   Health Maintenance Items Addressed: Mammogram ordered, DEXA  ordered  Additional Screening:  Vision Screening: Recommended annual ophthalmology exams for early detection of glaucoma and other disorders of the eye.  Dental Screening: Recommended annual dental exams for proper oral hygiene  Community Resource Referral / Chronic Care Management: CRR required this visit?  No   CCM required this visit?  No   Plan:    I have personally reviewed and noted the following in the patient's chart:   Medical and social history Use of alcohol, tobacco or illicit drugs  Current medications and supplements including opioid prescriptions. Patient is not currently taking opioid prescriptions. Functional ability and status Nutritional status Physical activity Advanced directives List of other physicians Hospitalizations, surgeries, and ER visits in previous 12 months Vitals Screenings to include cognitive, depression, and falls Referrals and appointments  In addition, I have reviewed and discussed with patient certain preventive protocols, quality metrics, and best practice recommendations. A written personalized care plan for preventive services as well as general preventive health recommendations were provided to patient.   Seabron Cypress [place], [place]   [id number]   After Visit Summary: (Mail) Due to this being a telephonic visit, the after visit summary with patients personalized plan was offered to patient via mail   Notes: Nothing significant to report at this time.

## 2023-09-21 NOTE — Patient Instructions (Signed)
 Ms. Hult , Thank you for taking time out of your busy schedule to complete your Annual Wellness Visit with me. I enjoyed our conversation and look forward to speaking with you again next year. I, as well as your care team,  appreciate your ongoing commitment to your health goals. Please review the following plan we discussed and let me know if I can assist you in the future. Your Game plan/ To Do List    Referrals: You have an order for:  []   2D Mammogram  [x]   3D Mammogram  [x]   Bone Density     Please call for appointment:   [facility] at [facility] [address] -Radiology [place], [place] [address] [phone]  Make sure to wear two-piece clothing.  No lotions, powders, or deodorants the day of the appointment. Make sure to bring picture ID and insurance card.  Bring list of medications you are currently taking including any supplements.   Follow up Visits: Next Medicare AWV with our clinical staff: In 1 year    Have you seen your provider in the last 6 months (3 months if uncontrolled diabetes)? No Next Office Visit with your provider: 12/20/23 @ 1:50  Clinician Recommendations:  Aim for 30 minutes of exercise or brisk walking, 6-8 glasses of water, and 5 servings of fruits and vegetables each day.       This is a list of the screening recommended for you and due dates:  Health Maintenance  Topic Date Due   Zoster (Shingles) Vaccine (1 of 2) Never done   COVID-19 Vaccine (3 - Moderna risk series) 09/09/2019   DTaP/Tdap/Td vaccine (2 - Td or Tdap) 07/18/2023   Flu Shot  11/23/2023   Mammogram  03/13/2024   Medicare Annual Wellness Visit  09/20/2024   Colon Cancer Screening  04/21/2026   Pneumonia Vaccine  Completed   DEXA scan (bone density measurement)  Completed   Hepatitis C Screening  Completed   HPV Vaccine  Aged Out   Meningitis B Vaccine  Aged Out    Advanced directives: (ACP Link)Information on Advanced Care Planning can be found at [facility] Advance Health Care Directives Advance Health Care Directives. [email or web address]   Advance Care Planning is important because it:  [x]  Makes sure you receive the medical care that is consistent with your values, goals, and preferences  [x]  It provides guidance to your family and loved ones and reduces their decisional burden about whether or not they are making the right decisions based on your wishes.  Follow the link provided in your after visit summary or read over the paperwork we have mailed to you to help you started getting your Advance Directives in place. If you need assistance in completing these, please reach out to us  so that we can help you!  See attachments for Preventive Care and Fall Prevention Tips.

## 2023-09-26 DIAGNOSIS — M0589 Other rheumatoid arthritis with rheumatoid factor of multiple sites: Secondary | ICD-10-CM | POA: Diagnosis not present

## 2023-10-01 ENCOUNTER — Other Ambulatory Visit: Payer: Self-pay | Admitting: Family Medicine

## 2023-10-11 DIAGNOSIS — K080 Exfoliation of teeth due to systemic causes: Secondary | ICD-10-CM | POA: Diagnosis not present

## 2023-11-15 DIAGNOSIS — K080 Exfoliation of teeth due to systemic causes: Secondary | ICD-10-CM | POA: Diagnosis not present

## 2023-12-20 ENCOUNTER — Encounter: Payer: Self-pay | Admitting: Family Medicine

## 2023-12-20 ENCOUNTER — Ambulatory Visit (INDEPENDENT_AMBULATORY_CARE_PROVIDER_SITE_OTHER): Admitting: Family Medicine

## 2023-12-20 VITALS — BP 93/66 | HR 74 | Temp 97.3°F | Ht 67.0 in | Wt 166.0 lb

## 2023-12-20 DIAGNOSIS — Z130 Encounter for screening for diseases of the blood and blood-forming organs and certain disorders involving the immune mechanism: Secondary | ICD-10-CM

## 2023-12-20 DIAGNOSIS — R61 Generalized hyperhidrosis: Secondary | ICD-10-CM | POA: Diagnosis not present

## 2023-12-20 DIAGNOSIS — E782 Mixed hyperlipidemia: Secondary | ICD-10-CM

## 2023-12-20 DIAGNOSIS — R0609 Other forms of dyspnea: Secondary | ICD-10-CM | POA: Diagnosis not present

## 2023-12-20 DIAGNOSIS — E039 Hypothyroidism, unspecified: Secondary | ICD-10-CM

## 2023-12-20 DIAGNOSIS — M8580 Other specified disorders of bone density and structure, unspecified site: Secondary | ICD-10-CM | POA: Diagnosis not present

## 2023-12-20 DIAGNOSIS — F32A Depression, unspecified: Secondary | ICD-10-CM

## 2023-12-20 MED ORDER — LEVOTHYROXINE SODIUM 88 MCG PO TABS: ORAL_TABLET | ORAL | 3 refills | Status: DC

## 2023-12-20 MED ORDER — ESOMEPRAZOLE MAGNESIUM 20 MG PO CPDR: 20.0000 mg | DELAYED_RELEASE_CAPSULE | Freq: Every day | ORAL | 3 refills | Status: AC

## 2023-12-20 MED ORDER — ROSUVASTATIN CALCIUM 10 MG PO TABS: 10.0000 mg | ORAL_TABLET | Freq: Every day | ORAL | 3 refills | Status: AC

## 2023-12-20 NOTE — Patient Instructions (Signed)
 Labs today.  Arranging Echo and Cardiology referral.  Follow up in 3 months.

## 2023-12-21 ENCOUNTER — Encounter: Payer: Self-pay | Admitting: Cardiology

## 2023-12-21 ENCOUNTER — Ambulatory Visit: Attending: Cardiology | Admitting: Cardiology

## 2023-12-21 ENCOUNTER — Other Ambulatory Visit: Payer: Self-pay

## 2023-12-21 ENCOUNTER — Ambulatory Visit: Payer: Self-pay | Admitting: Family Medicine

## 2023-12-21 VITALS — BP 118/72 | HR 64 | Ht 67.0 in | Wt 166.8 lb

## 2023-12-21 DIAGNOSIS — I209 Angina pectoris, unspecified: Secondary | ICD-10-CM | POA: Diagnosis not present

## 2023-12-21 DIAGNOSIS — R718 Other abnormality of red blood cells: Secondary | ICD-10-CM

## 2023-12-21 DIAGNOSIS — E782 Mixed hyperlipidemia: Secondary | ICD-10-CM

## 2023-12-21 DIAGNOSIS — Z136 Encounter for screening for cardiovascular disorders: Secondary | ICD-10-CM | POA: Diagnosis not present

## 2023-12-21 DIAGNOSIS — D72829 Elevated white blood cell count, unspecified: Secondary | ICD-10-CM | POA: Diagnosis not present

## 2023-12-21 DIAGNOSIS — R011 Cardiac murmur, unspecified: Secondary | ICD-10-CM

## 2023-12-21 DIAGNOSIS — M79644 Pain in right finger(s): Secondary | ICD-10-CM | POA: Diagnosis not present

## 2023-12-21 DIAGNOSIS — M0589 Other rheumatoid arthritis with rheumatoid factor of multiple sites: Secondary | ICD-10-CM | POA: Diagnosis not present

## 2023-12-21 DIAGNOSIS — M79645 Pain in left finger(s): Secondary | ICD-10-CM | POA: Diagnosis not present

## 2023-12-21 LAB — CMP14+EGFR
ALT: 28 IU/L (ref 0–32)
AST: 26 IU/L (ref 0–40)
Albumin: 4.4 g/dL (ref 3.9–4.9)
Alkaline Phosphatase: 92 IU/L (ref 44–121)
BUN/Creatinine Ratio: 15 (ref 12–28)
BUN: 12 mg/dL (ref 8–27)
Bilirubin Total: 0.3 mg/dL (ref 0.0–1.2)
CO2: 23 mmol/L (ref 20–29)
Calcium: 9.9 mg/dL (ref 8.7–10.3)
Chloride: 104 mmol/L (ref 96–106)
Creatinine, Ser: 0.8 mg/dL (ref 0.57–1.00)
Globulin, Total: 2.1 g/dL (ref 1.5–4.5)
Glucose: 88 mg/dL (ref 70–99)
Potassium: 4.9 mmol/L (ref 3.5–5.2)
Sodium: 141 mmol/L (ref 134–144)
Total Protein: 6.5 g/dL (ref 6.0–8.5)
eGFR: 81 mL/min/1.73 (ref >59)

## 2023-12-21 LAB — LIPID PANEL
Chol/HDL Ratio: 2.6 ratio (ref 0.0–4.4)
Cholesterol, Total: 144 mg/dL (ref 100–199)
HDL: 55 mg/dL (ref >39)
LDL Chol Calc (NIH): 74 mg/dL (ref 0–99)
Triglycerides: 77 mg/dL (ref 0–149)
VLDL Cholesterol Cal: 15 mg/dL (ref 5–40)

## 2023-12-21 LAB — CBC
Hematocrit: 40.1 % (ref 34.0–46.6)
Hemoglobin: 12.7 g/dL (ref 11.1–15.9)
MCH: 31.4 pg (ref 26.6–33.0)
MCHC: 31.7 g/dL (ref 31.5–35.7)
MCV: 99 fL — ABNORMAL HIGH (ref 79–97)
Platelets: 290 x10E3/uL (ref 150–450)
RBC: 4.05 x10E6/uL (ref 3.77–5.28)
RDW: 12 % (ref 11.7–15.4)
WBC: 8 x10E3/uL (ref 3.4–10.8)

## 2023-12-21 LAB — TSH: TSH: 0.552 u[IU]/mL (ref 0.450–4.500)

## 2023-12-21 MED ORDER — METOPROLOL TARTRATE 50 MG PO TABS: 50.0000 mg | ORAL_TABLET | Freq: Once | ORAL | 0 refills | Status: DC

## 2023-12-21 NOTE — Patient Instructions (Addendum)
 Medication Instructions:  Your physician recommends that you continue on your current medications as directed. Please refer to the Current Medication list given to you today.  Labwork: none  Testing/Procedures: Your physician has requested that you have an echocardiogram. Echocardiography is a painless test that uses sound waves to create images of your heart. It provides your doctor with information about the size and shape of your heart and how well your heart's chambers and valves are working. This procedure takes approximately one hour. There are no restrictions for this procedure. Please do NOT wear cologne, perfume, aftershave, or lotions (deodorant is allowed). Please arrive 15 minutes prior to your appointment time.  Please note: We ask at that you not bring children with you during ultrasound (echo/ vascular) testing. Due to room size and safety concerns, children are not allowed in the ultrasound rooms during exams. Our front office staff cannot provide observation of children in our lobby area while testing is being conducted. An adult accompanying a patient to their appointment will only be allowed in the ultrasound room at the discretion of the ultrasound technician under special circumstances. We apologize for any inconvenience. Coronary CTA-see instructions below.  Follow-Up: Your physician recommends that you schedule a follow-up appointment in: pending  Any Other Special Instructions Will Be Listed Below (If Applicable).  If you need a refill on your cardiac medications before your next appointment, please call your pharmacy.    Your cardiac CT will be scheduled at one of the below locations:   [facility] [address]  [place], [place] [address] [phone]  If scheduled at the Heart and Vascular Tower at [facility] street, please enter the parking lot using the [address] entrance and use the FREE valet service at the patient  drop-off area. Enter the building and check-in with registration on the main floor.  Please follow these instructions carefully (unless otherwise directed):  An IV will be required for this test and Nitroglycerin will be given.  Hold all erectile dysfunction medications at least 3 days (72 hrs) prior to test. (Ie viagra, cialis, sildenafil, tadalafil, etc)   On the Night Before the Test: Be sure to Drink plenty of water. Do not consume any caffeinated/decaffeinated beverages or chocolate 12 hours prior to your test. Do not take any antihistamines 12 hours prior to your test.  If the patient has contrast allergy: No contrast allergy  On the Day of the Test: Drink plenty of water until 1 hour prior to the test. Do not eat any food 1 hour prior to test. You may take your regular medications prior to the test.  Take metoprolol  (Lopressor ) 50 mg two hours prior to test. FEMALES- please wear underwire-free bra if available, avoid dresses & tight clothing      After the Test: Drink plenty of water. After receiving IV contrast, you may experience a mild flushed feeling. This is normal. On occasion, you may experience a mild rash up to 24 hours after the test. This is not dangerous. If this occurs, you can take Benadryl 25 mg, Zyrtec, Claritin, or Allegra and increase your fluid intake. (Patients taking Tikosyn should avoid Benadryl, and may take Zyrtec, Claritin, or Allegra) If you experience trouble breathing, this can be serious. If it is severe call 911 IMMEDIATELY. If it is mild, please call our office.  We will call to schedule your test 2-4 weeks out understanding that some insurance companies will need an authorization prior to the service  being performed.   For more information and frequently asked questions, please visit our website : [email or web address]  For non-scheduling related questions, please contact the cardiac imaging nurse navigator should you have any  questions/concerns: Cardiac Imaging Nurse Navigators Direct Office Dial: [phone]   For scheduling needs, including cancellations and rescheduling, please call [place], [phone].

## 2023-12-21 NOTE — Progress Notes (Signed)
 Cardiology Office Note  Date: 12/21/2023   ID: Ermagene, Saidi 1955-05-24, MRN [id number]  PCP: Cook, Jayce G, DO  Chief Complaint:  Chief Complaint  Patient presents with   Exertional diaphoresis and nausea   History of Present Illness: Kristy Moon is a 68 y.o. female referred for cardiology consultation by Dr. Bluford for evaluation of exertional diaphoresis and nausea.  She tells me that over the last 3 years she has been experiencing episodes of exertional excessive sweating often associated with nausea and sometimes frank emesis.  She does not report tightness in her chest or breathlessness necessarily, but does feel very fatigued when this happens.  She has a 3 mile walk that she does on most days, does not have these symptoms consistently, more in a sporadic pattern.  She had an episode similar to this while painting her deck and it can occur with other activities as well.  She does not report any sense of palpitations and has had no frank syncope.  I saw Ms. Sadik in consultation back in August 2020 for evaluation of dyspnea on exertion at which point Lexiscan  Myoview  was negative for ischemia with LVEF 59%.  He states that the current symptoms are different.  We discussed her medications.  She reports compliance with Crestor , recent lipid panel showed LDL 74.  Remainder of her history as noted below.  I reviewed her ECG today which shows normal sinus rhythm.  Review of Systems: As outlined in the history of present illness.  No orthopnea or PND.  No claudication.  No leg swelling.  Past Medical History: Past Medical History:  Diagnosis Date   Depression    GERD (gastroesophageal reflux disease)    Hyperlipidemia    Hypothyroidism    IBS (irritable bowel syndrome)    Osteopenia    Rheumatoid arthritis (HCC)    Vocal cord nodule    Past Surgical History: Past Surgical History:  Procedure Laterality Date   COLONOSCOPY N/A 04/21/2016   Procedure:  COLONOSCOPY;  Surgeon: Margo LITTIE Haddock, MD;  Location: [facility];  Service: Endoscopy;  Laterality: N/A;  10:30 Am   EYE SURGERY Bilateral    Family History: Family History  Problem Relation Age of Onset   Diabetes Other    Social History:  Social History   Tobacco Use   Smoking status: Former    Current packs/day: 0.00    Average packs/day: 1.5 packs/day for 10.0 years (15.0 ttl pk-yrs)    Types: Cigarettes    Start date: 03/19/1972    Quit date: 03/19/1982    Years since quitting: 41.7   Smokeless tobacco: Never  Substance Use Topics   Alcohol use: No    Alcohol/week: 0.0 standard drinks of alcohol   Medications: Current Outpatient Medications on File Prior to Visit  Medication Sig Dispense Refill   Cholecalciferol (VITAMIN D3) 2000 units capsule Take 2,000 Units by mouth daily.     escitalopram  (LEXAPRO ) 10 MG tablet TAKE 1 TABLET DAILY 90 tablet 3   esomeprazole  (NEXIUM ) 20 MG capsule Take 1 capsule (20 mg total) by mouth daily. 90 capsule 3   ibuprofen (ADVIL,MOTRIN) 200 MG tablet Take 600 mg by mouth every 4 (four) hours as needed for headache or moderate pain.     Krill Oil 500 MG CAPS Take 500 mg by mouth daily.     leflunomide (ARAVA) 20 MG tablet Take 20 mg by mouth daily.     levothyroxine  (SYNTHROID ) 88 MCG tablet TAKE 1  TABLET 30 MINUTES PRIOR TO BREAKFAST ON AN EMPTY STOMACH 6 DAYS PER WEEK, SKIP SUNDAYS 78 tablet 3   Melatonin 5 MG TABS Take 5 mg by mouth at bedtime.     rosuvastatin  (CRESTOR ) 10 MG tablet Take 1 tablet (10 mg total) by mouth daily. 90 tablet 3   No current facility-administered medications on file prior to visit.   Allergies: No Known Allergies Physical Exam: VS:  BP 118/72   Pulse 64   Ht 5' 7 (1.702 m)   Wt 166 lb 12.8 oz (75.7 kg)   SpO2 93%   BMI 26.12 kg/m , BMI Body mass index is 26.12 kg/m.  Wt Readings from Last 3 Encounters:  12/21/23 166 lb 12.8 oz (75.7 kg)  12/20/23 166 lb (75.3 kg)  09/21/23 159 lb (72.1 kg)     General: Patient appears comfortable at rest. HEENT: Conjunctiva and lids normal, oropharynx clear. Neck: Supple, no elevated JVP or carotid bruits. Lungs: Clear to auscultation, nonlabored breathing at rest. Cardiac: Regular rate and rhythm, no S3, 1/6 systolic murmur, no pericardial rub. Abdomen: Soft, nontender, bowel sounds present. Extremities: No pitting edema, distal pulses 2+. Skin: Warm and dry. Musculoskeletal: No kyphosis. Neuropsychiatric: Alert and oriented x3, affect grossly appropriate.  ECG:  An ECG dated 10/15/2018 was personally reviewed today and demonstrated:  Normal sinus rhythm.  Labwork: 12/20/2023: ALT 28; AST 26; BUN 12; Creatinine, Ser 0.80; Hemoglobin 12.7; Platelets 290; Potassium 4.9; Sodium 141; TSH 0.552     Component Value Date/Time   CHOL 144 12/20/2023 1516   TRIG 77 12/20/2023 1516   HDL 55 12/20/2023 1516   CHOLHDL 2.6 12/20/2023 1516   CHOLHDL 3.7 11/13/2014 1112   VLDL 20 11/13/2014 1112   LDLCALC 74 12/20/2023 1516   Other Studies Reviewed Today:  Chest x-ray 10/15/2018: FINDINGS: Mediastinum hilar structures normal. Lungs are clear. No pleural effusion or pneumothorax. No evidence of displaced rib fracture.   IMPRESSION: No acute abnormality identified.  Assessment and Plan:  1.  Recurring exertional diaphoresis often associated with nausea and sometimes frank emesis in a 68 year old woman with history of mixed hyperlipidemia.  Also has hypothyroidism which has been controlled on therapy, no known hypertension or type 2 diabetes mellitus.  Last ischemic assessment was in 2020.  She states that the symptoms have been recurring over the last 3 years and are different in comparison to her evaluation in 2020.  Concern would be for angina pectoris with atypical presentation, plan to proceed with a coronary CTA for further evaluation.  She also has a relatively soft cardiac murmur and an echocardiogram will be obtained for baseline.  2.  Mixed  hyperlipidemia, currently on Crestor  10 mg daily.  Recent LDL 74 and HDL 55.  Disposition:  Follow up test results.  Signed, Jayson JUDITHANN Sierras, M.D., F.A.C.C. [facility]

## 2023-12-22 LAB — B12 AND FOLATE PANEL
Folate: >20 ng/mL (ref >3.0)
Vitamin B-12: 1133 pg/mL (ref 232–1245)

## 2023-12-24 ENCOUNTER — Other Ambulatory Visit: Payer: Self-pay | Admitting: Family Medicine

## 2023-12-24 ENCOUNTER — Ambulatory Visit: Payer: Self-pay | Admitting: Family Medicine

## 2023-12-24 DIAGNOSIS — R011 Cardiac murmur, unspecified: Secondary | ICD-10-CM

## 2023-12-24 DIAGNOSIS — R61 Generalized hyperhidrosis: Secondary | ICD-10-CM | POA: Insufficient documentation

## 2023-12-24 NOTE — Assessment & Plan Note (Signed)
 Stable.  Continue current dosing levothyroxine .  Refilled today.

## 2023-12-24 NOTE — Assessment & Plan Note (Addendum)
 Symptoms concerning for cardiac cause.  Referring to cardiology.  Needs echo.  Needs assessment of the coronary arteries.  I spoke with cardiologist Dr. Debera.  He will see her ASAP.

## 2023-12-24 NOTE — Progress Notes (Signed)
 Subjective:  Patient ID: Kristy Moon, female    DOB: 06-Jul-1955  Age: 68 y.o. MRN: [id number]  CC:   Chief Complaint  Patient presents with   Follow-up     rheumatoid arthritis, hypothyroidism, osteopenia,hyperlipidemia    HPI:  68 year old female with hypothyroidism, osteopenia, rheumatoid arthritis, hyperlipidemia, depression presents for follow-up  Patient reports that over the past 3 years she has had significant exertional diaphoresis.  Often associated with nausea and vomiting.  Associated exertional fatigue.  No syncope.  No reports of chest pain.  Seems to be getting worse over the past few weeks.  Hypothyroidism has been stable.  Depression stable as well.  Lipids quite well-controlled on Crestor .  Most recent LDL 74.  Recent labs revealed mildly elevated MCV.  Normal B12 and folate.  Patient Active Problem List   Diagnosis Date Noted   Diaphoresis 12/24/2023   RA (rheumatoid arthritis) (HCC) 06/15/2021   Osteopenia 10/15/2017   Chronic depression 04/01/2015   Hyperlipidemia 07/02/2013   Hypothyroidism 07/02/2013    Social Hx   Social History   Socioeconomic History   Marital status: Married    Spouse name: Not on file   Number of children: Not on file   Years of education: Not on file   Highest education level: Not on file  Occupational History   Not on file  Tobacco Use   Smoking status: Former    Current packs/day: 0.00    Average packs/day: 1.5 packs/day for 10.0 years (15.0 ttl pk-yrs)    Types: Cigarettes    Start date: 03/19/1972    Quit date: 03/19/1982    Years since quitting: 41.7   Smokeless tobacco: Never  Vaping Use   Vaping status: Never Used  Substance and Sexual Activity   Alcohol use: No    Alcohol/week: 0.0 standard drinks of alcohol   Drug use: No   Sexual activity: Not on file  Other Topics Concern   Not on file  Social History Narrative   Not on file   Social Drivers of Health   Financial Resource Strain: Low Risk   (09/21/2023)   Overall Financial Resource Strain (CARDIA)    Difficulty of Paying Living Expenses: Not hard at all  Food Insecurity: No Food Insecurity (09/21/2023)   Hunger Vital Sign    Worried About Running Out of Food in the Last Year: Never true    Ran Out of Food in the Last Year: Never true  Transportation Needs: No Transportation Needs (09/21/2023)   PRAPARE - Administrator, Civil Service (Medical): No    Lack of Transportation (Non-Medical): No  Physical Activity: Insufficiently Active (09/21/2023)   Exercise Vital Sign    Days of Exercise per Week: 3 days    Minutes of Exercise per Session: 30 min  Stress: No Stress Concern Present (09/21/2023)   [employer] of Occupational Health - Occupational Stress Questionnaire    Feeling of Stress : Not at all  Social Connections: Socially Integrated (09/21/2023)   Social Connection and Isolation Panel    Frequency of Communication with Friends and Family: More than three times a week    Frequency of Social Gatherings with Friends and Family: Three times a week    Attends Religious Services: More than 4 times per year    Active Member of Clubs or Organizations: Yes    Attends Banker Meetings: More than 4 times per year    Marital Status: Married    Review of  Systems Per HPI  Objective:  BP 93/66   Pulse 74   Temp (!) 97.3 F (36.3 C)   Ht 5' 7 (1.702 m)   Wt 166 lb (75.3 kg)   SpO2 95%   BMI 26.00 kg/m      12/21/2023    4:12 PM 12/20/2023    1:44 PM 09/21/2023    1:51 PM  BP/Weight  Systolic BP 118 93 --  Diastolic BP 72 66 --  Wt. (Lbs) 166.8 166 159  BMI 26.12 kg/m2 26 kg/m2 24.9 kg/m2    Physical Exam Vitals and nursing note reviewed.  Constitutional:      General: She is not in acute distress.    Appearance: Normal appearance.  HENT:     Head: Normocephalic and atraumatic.  Cardiovascular:     Rate and Rhythm: Normal rate and regular rhythm.     Heart sounds: Murmur heard.   Pulmonary:     Effort: Pulmonary effort is normal.     Breath sounds: Normal breath sounds. No wheezing, rhonchi or rales.  Neurological:     Mental Status: She is alert.  Psychiatric:        Mood and Affect: Mood normal.        Behavior: Behavior normal.     Lab Results  Component Value Date   WBC 8.0 12/20/2023   HGB 12.7 12/20/2023   HCT 40.1 12/20/2023   PLT 290 12/20/2023   GLUCOSE 88 12/20/2023   CHOL 144 12/20/2023   TRIG 77 12/20/2023   HDL 55 12/20/2023   LDLCALC 74 12/20/2023   ALT 28 12/20/2023   AST 26 12/20/2023   NA 141 12/20/2023   K 4.9 12/20/2023   CL 104 12/20/2023   CREATININE 0.80 12/20/2023   BUN 12 12/20/2023   CO2 23 12/20/2023   TSH 0.552 12/20/2023     Assessment & Plan:  Diaphoresis Assessment & Plan: Symptoms concerning for cardiac cause.  Referring to cardiology.  Needs echo.  Needs assessment of the coronary arteries.  I spoke with cardiologist Dr. Debera.  He will see her ASAP.  Orders: -     Ambulatory referral to Cardiology  Mixed hyperlipidemia Assessment & Plan: Stable.  Continue Crestor .  Orders: -     Rosuvastatin  Calcium ; Take 1 tablet (10 mg total) by mouth daily.  Dispense: 90 tablet; Refill: 3 -     Lipid panel  Hypothyroidism, unspecified type Assessment & Plan: Stable.  Continue current dosing levothyroxine .  Refilled today.  Orders: -     Levothyroxine  Sodium; TAKE 1 TABLET 30 MINUTES PRIOR TO BREAKFAST ON AN EMPTY STOMACH 6 DAYS PER WEEK, SKIP SUNDAYS  Dispense: 78 tablet; Refill: 3 -     TSH  Osteopenia, unspecified location -     CMP14+EGFR  Screening for deficiency anemia -     CBC  Chronic depression Assessment & Plan: Stable on Lexapro .  Continue.   Other orders -     Esomeprazole  Magnesium ; Take 1 capsule (20 mg total) by mouth daily.  Dispense: 90 capsule; Refill: 3    Follow-up: 3 months  K[place]undra Hair Bluford DO [facility]

## 2023-12-24 NOTE — Assessment & Plan Note (Signed)
Stable on Lexapro.  Continue. 

## 2023-12-24 NOTE — Assessment & Plan Note (Signed)
 Stable. Continue Crestor.

## 2023-12-31 ENCOUNTER — Ambulatory Visit (HOSPITAL_COMMUNITY)

## 2024-01-07 ENCOUNTER — Ambulatory Visit (HOSPITAL_COMMUNITY)

## 2024-01-09 ENCOUNTER — Other Ambulatory Visit

## 2024-01-23 ENCOUNTER — Ambulatory Visit: Attending: Cardiology

## 2024-01-23 DIAGNOSIS — R011 Cardiac murmur, unspecified: Secondary | ICD-10-CM | POA: Diagnosis not present

## 2024-01-24 ENCOUNTER — Encounter (HOSPITAL_COMMUNITY): Payer: Self-pay

## 2024-01-25 ENCOUNTER — Ambulatory Visit: Payer: Self-pay | Admitting: Cardiology

## 2024-01-25 LAB — ECHOCARDIOGRAM COMPLETE
AR max vel: 2.94 cm2
AV Area VTI: 2.97 cm2
AV Area mean vel: 3.12 cm2
AV Mean grad: 4.1 mmHg
AV Peak grad: 9 mmHg
Ao pk vel: 1.5 m/s
Area-P 1/2: 3.27 cm2
Calc EF: 66.7 %
S' Lateral: 2.5 cm
Single Plane A2C EF: 66.7 %
Single Plane A4C EF: 65.4 %

## 2024-01-28 ENCOUNTER — Ambulatory Visit (HOSPITAL_COMMUNITY)
Admission: RE | Admit: 2024-01-28 | Discharge: 2024-01-28 | Disposition: A | Source: Ambulatory Visit | Attending: Cardiology | Admitting: Cardiology

## 2024-01-28 DIAGNOSIS — I209 Angina pectoris, unspecified: Secondary | ICD-10-CM

## 2024-01-28 DIAGNOSIS — I25119 Atherosclerotic heart disease of native coronary artery with unspecified angina pectoris: Secondary | ICD-10-CM | POA: Insufficient documentation

## 2024-01-28 MED ORDER — NITROGLYCERIN 0.4 MG SL SUBL
0.8000 mg | SUBLINGUAL_TABLET | Freq: Once | SUBLINGUAL | Status: AC
  Administered 2024-01-28: 0.8 mg via SUBLINGUAL

## 2024-01-28 MED ORDER — IOHEXOL 350 MG/ML SOLN
100.0000 mL | Freq: Once | INTRAVENOUS | Status: AC | PRN
  Administered 2024-01-28: 100 mL via INTRAVENOUS

## 2024-01-28 MED ORDER — SODIUM CHLORIDE 0.9 % IV SOLN: Freq: Once | INTRAVENOUS | Status: AC

## 2024-02-01 ENCOUNTER — Other Ambulatory Visit (HOSPITAL_COMMUNITY)

## 2024-02-01 ENCOUNTER — Ambulatory Visit (HOSPITAL_COMMUNITY)

## 2024-02-06 ENCOUNTER — Other Ambulatory Visit (HOSPITAL_COMMUNITY)

## 2024-02-06 ENCOUNTER — Ambulatory Visit (HOSPITAL_COMMUNITY)
Admission: RE | Admit: 2024-02-06 | Discharge: 2024-02-06 | Disposition: A | Discharge status: Home / Self Care | Source: Ambulatory Visit | Attending: Family Medicine | Admitting: Family Medicine

## 2024-02-06 DIAGNOSIS — Z1231 Encounter for screening mammogram for malignant neoplasm of breast: Secondary | ICD-10-CM | POA: Insufficient documentation

## 2024-02-25 DIAGNOSIS — H35363 Drusen (degenerative) of macula, bilateral: Secondary | ICD-10-CM | POA: Diagnosis not present

## 2024-02-25 DIAGNOSIS — Z961 Presence of intraocular lens: Secondary | ICD-10-CM | POA: Diagnosis not present

## 2024-03-18 ENCOUNTER — Ambulatory Visit (INDEPENDENT_AMBULATORY_CARE_PROVIDER_SITE_OTHER): Admitting: Family Medicine

## 2024-03-18 ENCOUNTER — Encounter: Payer: Self-pay | Admitting: Family Medicine

## 2024-03-18 VITALS — BP 108/70 | HR 67 | Ht 66.0 in | Wt 165.0 lb

## 2024-03-18 DIAGNOSIS — E782 Mixed hyperlipidemia: Secondary | ICD-10-CM

## 2024-03-18 DIAGNOSIS — I2510 Atherosclerotic heart disease of native coronary artery without angina pectoris: Secondary | ICD-10-CM

## 2024-03-18 NOTE — Patient Instructions (Signed)
 Follow up in 6 months.  If we can help with the claim, please let us  know.  Take care  Dr. Bluford

## 2024-03-22 NOTE — Assessment & Plan Note (Signed)
 Overall doing well

## 2024-03-22 NOTE — Progress Notes (Signed)
 Subjective:  Patient ID: Kristy Moon, female    DOB: 1956/04/13  Age: 68 y.o. MRN: [id number]  CC:   Chief Complaint  Patient presents with   Medical Management of Chronic Issues    Three month follow up    HPI:  68 year old female presents for follow up.  Still having exertional diaphoresis but work up has been reassuring (she has nonobstructive CAD; echo looked good).  She is overall feeling well.   Lipids fairly well controlled. Last LDL was 74. Goal less than 70.  Patient Active Problem List   Diagnosis Date Noted   CAD (coronary artery disease) 03/18/2024   RA (rheumatoid arthritis) (HCC) 06/15/2021   Osteopenia 10/15/2017   Chronic depression 04/01/2015   Hyperlipidemia 07/02/2013   Hypothyroidism 07/02/2013    Social Hx   Social History   Socioeconomic History   Marital status: Married    Spouse name: Not on file   Number of children: Not on file   Years of education: Not on file   Highest education level: Not on file  Occupational History   Not on file  Tobacco Use   Smoking status: Former    Current packs/day: 0.00    Average packs/day: 1.5 packs/day for 10.0 years (15.0 ttl pk-yrs)    Types: Cigarettes    Start date: 03/19/1972    Quit date: 03/19/1982    Years since quitting: 42.0   Smokeless tobacco: Never  Vaping Use   Vaping status: Never Used  Substance and Sexual Activity   Alcohol use: No    Alcohol/week: 0.0 standard drinks of alcohol   Drug use: No   Sexual activity: Not on file  Other Topics Concern   Not on file  Social History Narrative   Not on file   Social Drivers of Health   Financial Resource Strain: Low Risk  (09/21/2023)   Overall Financial Resource Strain (CARDIA)    Difficulty of Paying Living Expenses: Not hard at all  Food Insecurity: No Food Insecurity (09/21/2023)   Hunger Vital Sign    Worried About Running Out of Food in the Last Year: Never true    Ran Out of Food in the Last Year: Never true   Transportation Needs: No Transportation Needs (09/21/2023)   PRAPARE - Administrator, Civil Service (Medical): No    Lack of Transportation (Non-Medical): No  Physical Activity: Insufficiently Active (09/21/2023)   Exercise Vital Sign    Days of Exercise per Week: 3 days    Minutes of Exercise per Session: 30 min  Stress: No Stress Concern Present (09/21/2023)   [employer] of Occupational Health - Occupational Stress Questionnaire    Feeling of Stress : Not at all  Social Connections: Socially Integrated (09/21/2023)   Social Connection and Isolation Panel    Frequency of Communication with Friends and Family: More than three times a week    Frequency of Social Gatherings with Friends and Family: Three times a week    Attends Religious Services: More than 4 times per year    Active Member of Clubs or Organizations: Yes    Attends Banker Meetings: More than 4 times per year    Marital Status: Married    Review of Systems Per HPI  Objective:  BP 108/70   Pulse 67   Ht 5' 6 (1.676 m)   Wt 165 lb (74.8 kg)   SpO2 97%   BMI 26.63 kg/m  03/18/2024    1:08 PM 01/28/2024    3:54 PM 01/28/2024    3:16 PM  BP/Weight  Systolic BP 108 110 101  Diastolic BP 70 72 69  Wt. (Lbs) 165    BMI 26.63 kg/m2      Physical Exam Vitals and nursing note reviewed.  Constitutional:      General: She is not in acute distress.    Appearance: Normal appearance.  HENT:     Head: Normocephalic and atraumatic.  Eyes:     General:        Right eye: No discharge.        Left eye: No discharge.     Conjunctiva/sclera: Conjunctivae normal.  Cardiovascular:     Rate and Rhythm: Normal rate and regular rhythm.  Pulmonary:     Effort: Pulmonary effort is normal.     Breath sounds: Normal breath sounds. No wheezing, rhonchi or rales.  Neurological:     Mental Status: She is alert.  Psychiatric:        Mood and Affect: Mood normal.        Behavior: Behavior  normal.     Lab Results  Component Value Date   WBC 8.0 12/20/2023   HGB 12.7 12/20/2023   HCT 40.1 12/20/2023   PLT 290 12/20/2023   GLUCOSE 88 12/20/2023   CHOL 144 12/20/2023   TRIG 77 12/20/2023   HDL 55 12/20/2023   LDLCALC 74 12/20/2023   ALT 28 12/20/2023   AST 26 12/20/2023   NA 141 12/20/2023   K 4.9 12/20/2023   CL 104 12/20/2023   CREATININE 0.80 12/20/2023   BUN 12 12/20/2023   CO2 23 12/20/2023   TSH 0.552 12/20/2023     Assessment & Plan:  Coronary artery disease involving native coronary artery of native heart without angina pectoris Assessment & Plan: Overall doing well.    Mixed hyperlipidemia Assessment & Plan: LDL slightly above goal. Continue Crestor .     Follow-up:  6 months  Dodie Parisi Bluford DO [facility]

## 2024-03-22 NOTE — Assessment & Plan Note (Signed)
 LDL slightly above goal. Continue Crestor .

## 2024-04-09 DIAGNOSIS — M0580 Other rheumatoid arthritis with rheumatoid factor of unspecified site: Secondary | ICD-10-CM | POA: Diagnosis not present

## 2024-05-05 ENCOUNTER — Other Ambulatory Visit: Payer: Self-pay | Admitting: Family Medicine

## 2024-05-05 DIAGNOSIS — E039 Hypothyroidism, unspecified: Secondary | ICD-10-CM

## 2024-05-05 MED ORDER — LEVOTHYROXINE SODIUM 88 MCG PO TABS: ORAL_TABLET | ORAL | 3 refills | Status: AC

## 2024-05-05 NOTE — Telephone Encounter (Signed)
 Copied from CRM #[id number]. Topic: Clinical - Medication Refill >> May 05, 2024  1:57 PM Lauren C wrote: Medication: levothyroxine  (SYNTHROID ) 88 MCG tablet PT HAS BEEN OUT OF THIS MEDICATION 1 WEEK  Has the patient contacted their pharmacy? Yes They do not have record of this medication ([facility]   This is the patient's preferred pharmacy:  [email or web address] - [place], [place] - [address] [address] Phone: [phone] Fax: [phone]  Is this the correct pharmacy for this prescription? Yes If no, delete pharmacy and type the correct one.   Has the prescription been filled recently? Yes  Is the patient out of the medication? Yes  Has the patient been seen for an appointment in the last year OR does the patient have an upcoming appointment? Yes, apt for 09/16/24  Can we respond through MyChart? No, please call [phone]  Agent: Please be advised that Rx refills may take up to 3 business days. We ask that you follow-up with your pharmacy.

## 2024-09-16 ENCOUNTER — Ambulatory Visit: Admitting: Family Medicine

## 2024-09-26 ENCOUNTER — Ambulatory Visit
